# Patient Record
Sex: Female | Born: 1980 | Race: White | Hispanic: No | Smoking: Former smoker
Health system: Southern US, Community
[De-identification: ages and names within clinical notes are randomized; demographics above are authoritative.]

## PROBLEM LIST (undated history)

## (undated) DIAGNOSIS — E282 Polycystic ovarian syndrome: Secondary | ICD-10-CM

## (undated) DIAGNOSIS — M1711 Unilateral primary osteoarthritis, right knee: Secondary | ICD-10-CM

## (undated) DIAGNOSIS — M4802 Spinal stenosis, cervical region: Secondary | ICD-10-CM

## (undated) DIAGNOSIS — M5412 Radiculopathy, cervical region: Secondary | ICD-10-CM

## (undated) HISTORY — PX: COLONOSCOPY: SHX174

## (undated) HISTORY — DX: Polycystic ovarian syndrome: E28.2

## (undated) HISTORY — DX: Unilateral primary osteoarthritis, right knee: M17.11

## (undated) HISTORY — PX: AUGMENTATION MAMMAPLASTY: SUR837

## (undated) HISTORY — DX: Radiculopathy, cervical region: M54.12

## (undated) HISTORY — PX: OTHER SURGICAL HISTORY: SHX169

## (undated) HISTORY — DX: Spinal stenosis, cervical region: M48.02

---

## 2015-11-28 ENCOUNTER — Other Ambulatory Visit: Payer: Self-pay | Admitting: Physician Assistant

## 2015-11-28 DIAGNOSIS — M2392 Unspecified internal derangement of left knee: Secondary | ICD-10-CM

## 2015-12-16 ENCOUNTER — Ambulatory Visit: Payer: Self-pay

## 2015-12-19 ENCOUNTER — Ambulatory Visit: Payer: Self-pay

## 2015-12-23 ENCOUNTER — Ambulatory Visit: Payer: Self-pay

## 2017-08-05 ENCOUNTER — Ambulatory Visit (INDEPENDENT_AMBULATORY_CARE_PROVIDER_SITE_OTHER): Payer: Medicaid Other | Admitting: Obstetrics and Gynecology

## 2017-08-05 ENCOUNTER — Encounter: Payer: Self-pay | Admitting: Obstetrics and Gynecology

## 2017-08-05 VITALS — BP 148/91 | HR 110 | Ht 65.0 in | Wt 194.1 lb

## 2017-08-05 DIAGNOSIS — Z8742 Personal history of other diseases of the female genital tract: Secondary | ICD-10-CM | POA: Diagnosis not present

## 2017-08-05 DIAGNOSIS — E282 Polycystic ovarian syndrome: Secondary | ICD-10-CM | POA: Diagnosis not present

## 2017-08-05 DIAGNOSIS — N898 Other specified noninflammatory disorders of vagina: Secondary | ICD-10-CM

## 2017-08-05 DIAGNOSIS — R102 Pelvic and perineal pain: Secondary | ICD-10-CM | POA: Diagnosis not present

## 2017-08-05 NOTE — Progress Notes (Signed)
GYNECOLOGY PROGRESS NOTE  Subjective:    Patient ID: Rachel Rojas, female    DOB: 09/02/1981, 36 y.o.   MRN: [id number]  HPI  Patient is a 36 y.o. female who presents for complaints of abdominal/pelvic pain for the past several weeks.  Of note, patient has a h/o PCOS.  She is currently taking bio-identical hormones (thyroid,  Progesterone, and testosterone).  Seeing a Theatre stage manager.  Last year she had a similar abdominal pain, with associated bloating, and was diagnosed with ovarian and uterine (?) cysts. Did not require treatment.   States that her boyfriend works in Radiology and patient had an unofficial CT scan, which noted possible cysts again. Has images on disc.  Patient currently denies nausea/vomiting, dysuria, hematuria, constipation or diarrhea.   In addition, patient notes that last week had period, after not having period x 2 years. Bleeding stopped yesterday.    Past Medical History:  Diagnosis Date  . PCOS (polycystic ovarian syndrome)     Family History  Problem Relation Age of Onset  . Lung cancer Maternal Uncle   . Diabetes Maternal Grandmother   . Bladder Cancer Maternal Grandmother     Family History  Problem Relation Age of Onset  . Lung cancer Maternal Uncle   . Diabetes Maternal Grandmother   . Bladder Cancer Maternal Grandmother     History reviewed. No pertinent surgical history.   Social History   Social History  . Marital status: Divorced    Spouse name: N/A  . Number of children: N/A  . Years of education: N/A   Occupational History  . Not on file.   Social History Main Topics  . Smoking status: Light Tobacco Smoker  . Smokeless tobacco: Never Used  . Alcohol use No     Comment: 3x a week  . Drug use: No  . Sexual activity: Yes   Other Topics Concern  . Not on file   Social History Narrative  . No narrative on file    Outpatient Encounter Prescriptions as of 08/05/2017  Medication Sig  . metFORMIN (GLUCOPHAGE) 500 MG tablet  Take by mouth.  . Misc. Devices (TOPI-CLICK PERL DOSE LOAD 24MP) MISC USE AS DIRECTED  . NATURE-THROID 32.5 MG tablet (TOTAL DOSE = 130 MG) PLEASE TAKE 1 TABLET (WITH 97.5 MG TABLET) BY MOUTH EVERY MORNING AND AT 1PM 30 MINUTES BEFORE OR AFTER YOU EAT. KEEP  . NATURE-THROID 97.5 MG TABS (TOTAL DOSE = 130 MG) PLEASE TAKE 1 TABLET (WITH 32.5 MG TABLET) BY MOUTH EVERY MORNING AND AT 1PM 30 MINUTES BEFORE OR AFTER YOU EAT. KEEP  . PARoxetine (PAXIL) 20 MG tablet Take by mouth.  . Progesterone Micronized (PROGESTERONE PO) (TOTAL DOSE = 775 MG) TAKE 2 CAPSULES (WITH 175 MG CAPSULE) BY MOUTH EVERY NIGHT AT BEDTIME. MAY CAUSE DROWSINESS. KEEP AT ROOM TEMP & AWAY  . spironolactone (ALDACTONE) 50 MG tablet Take 50 mg by mouth 2 (two) times daily.  . Testosterone 20 % CREA APPLY 1 CLICK (=5.36 MILLILITER) VAGINALLY AT BEDTIME THREE TIMES A WEEK. KEEP AT [place].   No facility-administered encounter medications on file as of 08/05/2017.     No Known Allergies   Review of Systems Pertinent items noted in HPI and remainder of comprehensive ROS otherwise negative.     Objective:   Blood pressure (!) 148/91, pulse (!) 110, height 5\' 5"  (1.651 m), weight 194 lb 1.6 oz (88 kg), last menstrual period 12/17/2014. General  appearance: alert, cooperative and no distress Abdomen: soft, non-tender; bowel sounds normal; no masses,  no organomegaly Pelvic: external genitalia normal, rectovaginal septum normal.  Vagina with small amount of white thin discharge.  Cervix normal appearing, no lesions and no motion tenderness.  Uterus mobile, nontender, normal shape and size.  Adnexae non-palpable, nontender bilaterally.  Extremities: extremities normal, atraumatic, no cyanosis or edema Neurologic: Grossly normal   Assessment:   H/o PCOS Pelvic pain H/o ovarian cyst  Vaginal discharge  Plan:   - Patient possible h/o ovarian cysts, now with abdominal pain again.  Currently on  natural remedies for PCOS.  Will order pelvic ultrasound to assess for cause of pelvic pain. Discussed possible etiologies.  - Nuswab performed to r/o pelvic infection - Will notify patient of results by phone.    Rubie Maid, MD [facility]

## 2017-08-15 ENCOUNTER — Ambulatory Visit (INDEPENDENT_AMBULATORY_CARE_PROVIDER_SITE_OTHER): Payer: Medicaid Other

## 2017-08-15 DIAGNOSIS — R102 Pelvic and perineal pain: Secondary | ICD-10-CM

## 2017-08-15 DIAGNOSIS — Z8742 Personal history of other diseases of the female genital tract: Secondary | ICD-10-CM

## 2017-09-04 ENCOUNTER — Telehealth: Payer: Self-pay

## 2017-09-04 NOTE — Telephone Encounter (Signed)
-----   Message from Rubie Maid, MD sent at 09/04/2017  8:59 AM EDT ----- Please contact patient and inform her of her normal ultrasound.

## 2017-09-04 NOTE — Telephone Encounter (Signed)
Called pt, line dropped call. Will call back.

## 2017-09-05 NOTE — Telephone Encounter (Signed)
Called pt, no answer. LM for pt to call back.  

## 2017-09-20 NOTE — Telephone Encounter (Signed)
Called pt, no answer. LM for pt informing her of normal results.

## 2018-08-12 ENCOUNTER — Ambulatory Visit: Payer: Self-pay | Admitting: Physician Assistant

## 2019-02-17 ENCOUNTER — Telehealth: Payer: Self-pay | Admitting: Obstetrics and Gynecology

## 2019-02-17 NOTE — Telephone Encounter (Signed)
The patient called and stated and that she has been bleeding for 2 months. The patient wants to see Dr. Marcelline Mates asap, I scheduled the patient for next week, but the patient does not want to wait that long. Please advise.

## 2019-02-24 ENCOUNTER — Ambulatory Visit: Payer: Self-pay | Admitting: Obstetrics and Gynecology

## 2019-02-24 ENCOUNTER — Encounter: Payer: Self-pay | Admitting: Obstetrics and Gynecology

## 2019-02-24 ENCOUNTER — Other Ambulatory Visit (HOSPITAL_COMMUNITY)
Admission: RE | Admit: 2019-02-24 | Discharge: 2019-02-24 | Disposition: A | Discharge status: Home / Self Care | Payer: Medicaid Other | Source: Ambulatory Visit | Attending: Obstetrics and Gynecology | Admitting: Obstetrics and Gynecology

## 2019-02-24 VITALS — BP 136/93 | HR 98 | Ht 65.0 in | Wt 192.7 lb

## 2019-02-24 DIAGNOSIS — R5383 Other fatigue: Secondary | ICD-10-CM

## 2019-02-24 DIAGNOSIS — E669 Obesity, unspecified: Secondary | ICD-10-CM

## 2019-02-24 DIAGNOSIS — Z124 Encounter for screening for malignant neoplasm of cervix: Secondary | ICD-10-CM

## 2019-02-24 DIAGNOSIS — E282 Polycystic ovarian syndrome: Secondary | ICD-10-CM

## 2019-02-24 DIAGNOSIS — N938 Other specified abnormal uterine and vaginal bleeding: Secondary | ICD-10-CM

## 2019-02-24 MED ORDER — MEDROXYPROGESTERONE ACETATE 10 MG PO TABS: 10.0000 mg | ORAL_TABLET | Freq: Every day | ORAL | 4 refills | Status: DC

## 2019-02-24 NOTE — Patient Instructions (Signed)
Polycystic Ovarian Syndrome  Polycystic ovarian syndrome (PCOS) is a common hormonal disorder among women of reproductive age. In most women with PCOS, many small fluid-filled sacs (cysts) grow on the ovaries, and the cysts are not part of a normal menstrual cycle. PCOS can cause problems with your menstrual periods and make it difficult to get pregnant. It can also cause an increased risk of miscarriage with pregnancy. If it is not treated, PCOS can lead to serious health problems, such as diabetes and heart disease. What are the causes? The cause of PCOS is not known, but it may be the result of a combination of certain factors, such as:  Irregular menstrual cycle.  High levels of certain hormones (androgens).  Problems with the hormone that helps to control blood sugar (insulin resistance).  Certain genes. What increases the risk? This condition is more likely to develop in women who have a family history of PCOS. What are the signs or symptoms? Symptoms of PCOS may include:  Multiple ovarian cysts.  Infrequent periods or no periods.  Periods that are too frequent or too heavy.  Unpredictable periods.  Inability to get pregnant (infertility) because of not ovulating.  Increased growth of hair on the face, chest, stomach, back, thumbs, thighs, or toes.  Acne or oily skin. Acne may develop during adulthood, and it may not respond to treatment.  Pelvic pain.  Weight gain or obesity.  Patches of thickened and dark brown or black skin on the neck, arms, breasts, or thighs (acanthosis nigricans).  Excess hair growth on the face, chest, abdomen, or upper thighs (hirsutism). How is this diagnosed? This condition is diagnosed based on:  Your medical history.  A physical exam, including a pelvic exam. Your health care provider may look for areas of increased hair growth on your skin.  Tests, such as: ? Ultrasound. This may be used to examine the ovaries and the lining of the  uterus (endometrium) for cysts. ? Blood tests. These may be used to check levels of sugar (glucose), female hormone (testosterone), and female hormones (estrogen and progesterone) in your blood. How is this treated? There is no cure for PCOS, but treatment can help to manage symptoms and prevent more health problems from developing. Treatment varies depending on:  Your symptoms.  Whether you want to have a baby or whether you need birth control (contraception). Treatment may include nutrition and lifestyle changes along with:  Progesterone hormone to start a menstrual period.  Birth control pills to help you have regular menstrual periods.  Medicines to make you ovulate, if you want to get pregnant.  Medicine to reduce excessive hair growth.  Surgery, in severe cases. This may involve making small holes in one or both of your ovaries. This decreases the amount of testosterone that your body produces. Follow these instructions at home:  Take over-the-counter and prescription medicines only as told by your health care provider.  Follow a healthy meal plan. This can help you reduce the effects of PCOS. ? Eat a healthy diet that includes lean proteins, complex carbohydrates, fresh fruits and vegetables, low-fat dairy products, and healthy fats. Make sure to eat enough fiber.  If you are overweight, lose weight as told by your health care provider. ? Losing 10% of your body weight may improve symptoms. ? Your health care provider can determine how much weight loss is best for you and can help you lose weight safely.  Keep all follow-up visits as told by your health care provider.  provider.  ? Losing 10% of your body weight may improve symptoms.  ? Your health care provider can determine how much weight loss is best for you and can help you lose weight safely.  · Keep all follow-up visits as told by your health care provider. This is important.  Contact a health care provider if:  · Your symptoms do not get better with medicine.  · You develop new symptoms.  This information is not intended to replace advice given to you by your health care provider. Make sure you discuss any questions you have with your health care provider.  Document  Released: 03/29/2005 Document Revised: 07/31/2016 Document Reviewed: 05/20/2016  Elsevier Interactive Patient Education © 2019 [employer]

## 2019-02-24 NOTE — Progress Notes (Signed)
Pt is present today due to having her cycle for 2 months straight. Pt do not remember the first day it started. Pt stated that she is having heavy bleeding, cramping and clots.

## 2019-02-24 NOTE — Progress Notes (Signed)
Entered in Error

## 2019-02-24 NOTE — Progress Notes (Signed)
GYNECOLOGY CLINIC PROGRESS NOTE Subjective:   HPI: Rachel Rojas is a 38 y.o. female here for bleeding x 2 months.   Patient with a history of PCOS who was previously seeing a Theatre stage manager and taking bio-identical hormones (thyroid, progesterone, and testosterone) but stopped taking them along with all of her other medications 8 months ago because she could not afford to pay for them anymore. After ~4 months of being off medication, began having bleeding. For the last 2 months patient has been having constant heavy bleeding with clots and mild cramping in the morning that resolves quickly. Patient states that she has not gone no longer than 12 hours without bleeding over the last 2 months. She has also been feeling fatigued, weak, and dizzy at times. She reports that she is taking an iron supplement. She states that she does not need contraception because her boyfriend has had a vasectomy. She desires medication but does not want IUD or any medications with estrogen. Denies n/v, constipation,  diarrhea, dysuria, hematuria, or vaginal discharge.    Gynecologic History No LMP recorded. (Menstrual status: Irregular Periods). Contraception: vasectomy Last Pap: Patient reports that pap over 5 years ago. Results were: normal  Menstrual History: OB History    Gravida  2   Para  1   Term  1   Preterm      AB  1   Living  1     SAB      TAB      Ectopic      Multiple      Live Births  1             The following portions of the patient's history were reviewed and updated as appropriate:  She  has a past medical history of PCOS (polycystic ovarian syndrome).   She  has a past surgical history that includes no surgery history.   Her family history includes Bladder Cancer in her maternal grandmother; Diabetes in her maternal grandmother; Lung cancer in her maternal uncle.   She  reports that she has quit smoking. She has never used smokeless tobacco. She reports current  alcohol use. She reports that she does not use drugs.   Current Outpatient Medications on File Prior to Visit  Medication Sig Dispense Refill  . ferrous sulfate 325 (65 FE) MG tablet Take 325 mg by mouth daily with breakfast.     No current facility-administered medications on file prior to visit.    She has No Known Allergies..  Review of Systems Pertinent items noted in HPI and remainder of comprehensive ROS otherwise negative.    Objective:    BP (!) 136/93   Pulse 98   Ht 5\' 5"  (1.651 m)   Wt 192 lb 11.2 oz (87.4 kg)   BMI 32.07 kg/m   General:   alert, no distress and mildly obese  Abdomen:  soft, non-tender; bowel sounds normal; no masses,  no organomegaly  Pelvic:   external genitalia normal, rectovaginal septum normal.  Vagina without discharge, scant dark red blood in vaginal vault.  Cervix normal appearing, no lesions and no motion tenderness.  Uterus mobile, nontender, normal shape and size.  Adnexae non-palpable, nontender bilaterally.   Extremities:  extremities normal, atraumatic, no cyanosis or edema  Neurologic:  grossly normal     Assessment:    Polycystic ovarian disease   Dysfunctional uterine bleeding  Mild obesity  Fatigue Cervical cancer screening  Plan:    Blood tests: CBC with  diff, Estradiol, FSH, LH, Progesterone level, TSH and Iron studies, Testosterone and DHEA levels. Diagnosis explained in detail, including differential. Pelvic ultrasound.   Discussed management options, including non-estrogen containing contraceptives (declines estrogen therapy if possible) such as IUD, oral progesterone (Provera with q 3 month cycles vs progesterone-only OCPs), Nexplanon, Depo Provera.  Not interested in surgical management at this time. After discussion, patient notes she would like to do q 3 month Provera challenges. Will order.  Pap smear performed today in light of abnormal bleeding and overdue for cervical cancer screen.   Patient to f/u in 3 months for  annual exama.  She is follow up sooner if symptoms persist or worsen.    Rubie Maid, MD [facility]

## 2019-02-25 ENCOUNTER — Encounter: Payer: Self-pay | Admitting: Obstetrics and Gynecology

## 2019-02-25 LAB — CBC
HEMOGLOBIN: 14.7 g/dL (ref 11.1–15.9)
Hematocrit: 43.1 % (ref 34.0–46.6)
MCH: 30.8 pg (ref 26.6–33.0)
MCHC: 34.1 g/dL (ref 31.5–35.7)
MCV: 90 fL (ref 79–97)
Platelets: 291 10*3/uL (ref 150–450)
RBC: 4.78 x10E6/uL (ref 3.77–5.28)
RDW: 12.7 % (ref 11.7–15.4)
WBC: 7.7 10*3/uL (ref 3.4–10.8)

## 2019-02-25 LAB — IRON,TIBC AND FERRITIN PANEL
Ferritin: 38 ng/mL (ref 15–150)
Iron Saturation: 18 % (ref 15–55)
Iron: 56 ug/dL (ref 27–159)
Total Iron Binding Capacity: 307 ug/dL (ref 250–450)
UIBC: 251 ug/dL (ref 131–425)

## 2019-02-25 LAB — FSH/LH
FSH: 3.3 m[IU]/mL
LH: 7.1 m[IU]/mL

## 2019-02-25 LAB — TESTOSTERONE, FREE, TOTAL, SHBG
Sex Hormone Binding: 27.6 nmol/L (ref 24.6–122.0)
Testosterone, Free: 4.8 pg/mL — ABNORMAL HIGH (ref 0.0–4.2)
Testosterone: 33 ng/dL (ref 8–48)

## 2019-02-25 LAB — ESTRADIOL: Estradiol: 155.1 pg/mL

## 2019-02-25 LAB — TSH: TSH: 4.7 u[IU]/mL — AB (ref 0.450–4.500)

## 2019-02-25 LAB — PROGESTERONE: Progesterone: 0.2 ng/mL

## 2019-02-25 LAB — DHEA-SULFATE: DHEA-SO4: 350.2 ug/dL — ABNORMAL HIGH (ref 57.3–279.2)

## 2019-02-27 LAB — CYTOLOGY - PAP
Diagnosis: UNDETERMINED — AB
HPV: NOT DETECTED

## 2019-03-05 ENCOUNTER — Other Ambulatory Visit: Payer: Self-pay

## 2019-03-05 ENCOUNTER — Ambulatory Visit
Admission: RE | Admit: 2019-03-05 | Discharge: 2019-03-05 | Disposition: A | Discharge status: Home / Self Care | Payer: Medicaid Other | Source: Ambulatory Visit | Attending: Obstetrics and Gynecology | Admitting: Obstetrics and Gynecology

## 2019-03-05 DIAGNOSIS — E282 Polycystic ovarian syndrome: Secondary | ICD-10-CM

## 2019-03-07 MED ORDER — MEDROXYPROGESTERONE ACETATE 10 MG PO TABS: 10.0000 mg | ORAL_TABLET | Freq: Every day | ORAL | 4 refills | Status: DC

## 2019-03-07 NOTE — Telephone Encounter (Signed)
Yes. I can see what's going on. It's because we initially planned for you to only have to to take it every 3 months.  I will change the prescription so you can have access to it again.

## 2019-03-12 ENCOUNTER — Telehealth: Payer: Self-pay | Admitting: Obstetrics and Gynecology

## 2019-03-12 NOTE — Telephone Encounter (Signed)
The patient called and stated that she has an episode last night. The patient stated she is severally bleeding and needs to speak with her nurse as soon as possible. Please advise.

## 2019-03-12 NOTE — Telephone Encounter (Signed)
Pt stated that she had sexually intercourse last night and forgot to take her provera pill. Pt stated that she got up and took if as soon as she remembered. Pt stated that she started bleeding really heavy with clots and a lot of cramping. Pt stated that she was taking ibuprofen for the pain. Pt stated that she had to use 2 tampons to get from her bathroom to the kitchen. Pt made an appointment for a televisit on Monday, March 16, 2019 at 1:30pm. Pt asked when should she seek medical care. Pt was advised that if she felt lightheaded from all the of bleeding to go to an urgent care to seek medical care and have them check her hemoglobin.

## 2019-03-13 NOTE — Telephone Encounter (Signed)
What you informed her is correct.  She really needs to be on dual hormone therapy but has been declining so far. I will discuss this with her again on Monday when I speak to her.

## 2019-03-16 ENCOUNTER — Encounter: Payer: Self-pay | Admitting: Obstetrics and Gynecology

## 2019-03-16 ENCOUNTER — Ambulatory Visit (INDEPENDENT_AMBULATORY_CARE_PROVIDER_SITE_OTHER): Payer: Medicaid Other | Admitting: Obstetrics and Gynecology

## 2019-03-16 ENCOUNTER — Other Ambulatory Visit: Payer: Self-pay

## 2019-03-16 ENCOUNTER — Other Ambulatory Visit: Payer: Medicaid Other

## 2019-03-16 VITALS — Ht 65.0 in | Wt 192.0 lb

## 2019-03-16 DIAGNOSIS — N938 Other specified abnormal uterine and vaginal bleeding: Secondary | ICD-10-CM

## 2019-03-16 DIAGNOSIS — N939 Abnormal uterine and vaginal bleeding, unspecified: Secondary | ICD-10-CM

## 2019-03-16 DIAGNOSIS — Z130 Encounter for screening for diseases of the blood and blood-forming organs and certain disorders involving the immune mechanism: Secondary | ICD-10-CM

## 2019-03-16 MED ORDER — NORETHINDRONE ACET-ETHINYL EST 1.5-30 MG-MCG PO TABS: ORAL_TABLET | ORAL | 3 refills | Status: DC

## 2019-03-16 NOTE — Progress Notes (Signed)
Virtual Visit via Telephone Note  I connected with Rachel Rojas on 03/16/19 at  1:50 PM EDT by telephone and verified that I am speaking with the correct person using two identifiers.   I discussed the limitations, risks, security and privacy concerns of performing an evaluation and management service by telephone and the availability of in person appointments. I also discussed with the patient that there may be a patient responsible charge related to this service. The patient expressed understanding and agreed to proceed.  The patient was at home, and I placed the call from the office.    History of Present Illness: Patient is a 38 y.o. [id number] female who complains of worsening of her abnormal uterine bleeding.  She initially noted the abnormal bleeding ~ 3 months ago, was seen last month for the bleeding and was prescribed Provera 10 mg tablets for 10 days.  She notes that this did stop her bleeding for a short while, however 2-3 days later after stopping the course, the bleeding returned, however was much lighter.  She states that she was prepared to take a second round of the Provera but had difficulty getting her refill from the pharmacy for ~ 2 days.  She started the second course, but had intercourse on Day 1 of taking the next round of medication, and has been bleeding much heavier since that time. She now complains that she is feeling more tired and fatigued, and is currently still bleeding fairly heavily. She is also noting that her blood pressure is running lower than what it usually does. She denies SOB or chest pain.    Observations/Objective: Wt Readings from Last 3 Encounters:  03/16/19 192 lb (87.1 kg)  02/24/19 192 lb 11.2 oz (87.4 kg)  08/05/17 194 lb 1.6 oz (88 kg)   Temp Readings from Last 3 Encounters:  No data found for Temp   BP Readings from Last 3 Encounters:  02/24/19 (!) 136/93  08/05/17 (!) 148/91   Pulse Readings from Last 3 Encounters:  02/24/19 98  08/05/17 (!)  110    Last Imaging:   US PELVIS (TRANSABDOMINAL ONLY) CLINICAL DATA:  Dysfunctional uterine bleeding, irregular menses in vaginal bleeding for 2.5 months, history of polycystic ovarian syndrome, former smoker  EXAM: TRANSABDOMINAL AND TRANSVAGINAL ULTRASOUND OF PELVIS  TECHNIQUE: Both transabdominal and transvaginal ultrasound examinations of the pelvis were performed. Transabdominal technique was performed for global imaging of the pelvis including uterus, ovaries, adnexal regions, and pelvic cul-de-sac. It was necessary to proceed with endovaginal exam following the transabdominal exam to visualize the uterus, endometrium, and ovaries.  COMPARISON:  None  FINDINGS: Uterus  Measurements: 10.0 x 6.0 x 6.9 cm = volume: 218 mL. Normal morphology without mass  Endometrium  Thickness: 15 mm.  No endometrial fluid or focal abnormality  Right ovary  Measurements: 3.0 x 1.8 x 1.7 cm = volume: 4.9 mL. Dominant follicle without mass  Left ovary  Measurements: 3.3 x 2.1 x 2.2 cm = volume: 8.0 mL. Dominant follicle without mass  Other findings  Small amount of free pelvic fluid.  No adnexal masses.  IMPRESSION: Upper normal thickness of endometrial complex without focal abnormality.  Otherwise negative exam.  Electronically Signed   By: Lavonia Dana M.D.   On: 03/05/2019 15:31 US PELVIS TRANSVANGINAL NON-OB (TV ONLY) CLINICAL DATA:  Dysfunctional uterine bleeding, irregular menses in vaginal bleeding for 2.5 months, history of polycystic ovarian syndrome, former smoker  EXAM: TRANSABDOMINAL AND TRANSVAGINAL ULTRASOUND OF PELVIS  TECHNIQUE: Both transabdominal  and transvaginal ultrasound examinations of the pelvis were performed. Transabdominal technique was performed for global imaging of the pelvis including uterus, ovaries, adnexal regions, and pelvic cul-de-sac. It was necessary to proceed with endovaginal exam following the transabdominal exam to  visualize the uterus, endometrium, and ovaries.  COMPARISON:  None  FINDINGS: Uterus  Measurements: 10.0 x 6.0 x 6.9 cm = volume: 218 mL. Normal morphology without mass  Endometrium  Thickness: 15 mm.  No endometrial fluid or focal abnormality  Right ovary  Measurements: 3.0 x 1.8 x 1.7 cm = volume: 4.9 mL. Dominant follicle without mass  Left ovary  Measurements: 3.3 x 2.1 x 2.2 cm = volume: 8.0 mL. Dominant follicle without mass  Other findings  Small amount of free pelvic fluid.  No adnexal masses.  IMPRESSION: Upper normal thickness of endometrial complex without focal abnormality.  Otherwise negative exam.  Electronically Signed   By: Lavonia Dana M.D.   On: 03/05/2019 15:31    Labs:  Lab Results  Component Value Date   WBC 7.7 02/24/2019   HGB 14.7 02/24/2019   HCT 43.1 02/24/2019   MCV 90 02/24/2019   PLT 291 02/24/2019   Lab Results  Component Value Date   TSH 4.700 (H) 02/24/2019    Assessment and Plan:  1. Abnormal uterine bleeding - patient has been trying to treat with progesterone-only products thus far, however recommended that she likely needs both estrogen and progesterone in the short term to help manage her bleeding. Will prescribe OCP taper to temporize bleeding, and then can discuss further longer-term management with progesterone-only products if desired.  2. H/o hypothyroidism - TSH mildly elevated at last visit.  Patient aware that she needs to f/u with PCP and likely resume her thyroid medications (as she has been off for the past year or so). Could also be contributing to her symptoms.  3.  Fatigue - concern for anemia due to patient's new complaints of fatigue. Patient advised to f/u in clinic for lab draw today to evaluate for possible anemia, and if levels are severely low, may need to f/u in the Emergency Room for blood transfusion due to symptoms.  4. HTN - patient with h/o HTN, has been controlled, however noting symptoms of  lower than normal BPs lately.  Usually would recommend progesterone only options due to age over 37 and HTN, however patient should be ok to take OCP taper in emergency case (for short term), and then can decide on more long-term management once symptoms improve.   Follow Up Instructions:  Follow up today for stat lab draw.  Will notify patient of labs by phone, may need to f/u in the Emergency Room if levels are very low.  Will also need to start OCP taper. Prescription called to pharmacy.   I discussed the assessment and treatment plan with the patient. The patient was provided an opportunity to ask questions and all were answered. The patient agreed with the plan and demonstrated an understanding of the instructions.   The patient was advised to call back or seek an in-person evaluation if the symptoms worsen or if the condition fails to improve as anticipated.  I provided 10 minutes of non-face-to-face time during this encounter.   Rubie Maid, MD  [facility]

## 2019-03-16 NOTE — Progress Notes (Signed)
Pt televisit transferred from front desk. Medication, medical history, and allegies reviewed and updated. Pt stated that she has had a cycle and  bleeding for 3 months. Pt stated that she is having large clots and extreme cramps. Pt stated that she has been weak and tired.

## 2019-03-16 NOTE — Addendum Note (Signed)
Addended by: Lorinda Creed on: 03/16/2019 03:00 PM   Modules accepted: Orders

## 2019-03-17 ENCOUNTER — Telehealth: Payer: Self-pay | Admitting: Obstetrics and Gynecology

## 2019-03-17 LAB — IRON,TIBC AND FERRITIN PANEL
Ferritin: 36 ng/mL (ref 15–150)
Iron Saturation: 17 % (ref 15–55)
Iron: 50 ug/dL (ref 27–159)
Total Iron Binding Capacity: 302 ug/dL (ref 250–450)
UIBC: 252 ug/dL (ref 131–425)

## 2019-03-17 LAB — CBC
Hematocrit: 40 % (ref 34.0–46.6)
Hemoglobin: 13.9 g/dL (ref 11.1–15.9)
MCH: 31.1 pg (ref 26.6–33.0)
MCHC: 34.8 g/dL (ref 31.5–35.7)
MCV: 90 fL (ref 79–97)
Platelets: 322 10*3/uL (ref 150–450)
RBC: 4.47 x10E6/uL (ref 3.77–5.28)
RDW: 12.6 % (ref 11.7–15.4)
WBC: 9.2 10*3/uL (ref 3.4–10.8)

## 2019-03-17 NOTE — Telephone Encounter (Signed)
The patient called concerned about her not hearing anything back in regards to her bloodwork. Please advise.

## 2019-03-18 NOTE — Telephone Encounter (Signed)
Pt called no answer LM via voicemail to call the office to go over her test results.

## 2019-03-20 NOTE — Telephone Encounter (Signed)
Please see my chart messages

## 2019-05-26 ENCOUNTER — Telehealth: Payer: Self-pay

## 2019-05-26 NOTE — Progress Notes (Signed)
Pt is present today for a 3 month follow up after starting Junel for abnormal bleeding. Pt stated the Junel is working and her bleeding has slowed down but now she is having other issues like HRT issues.

## 2019-05-26 NOTE — Telephone Encounter (Signed)
Pt prescreened no symptoms has face mask.   Coronavirus (COVID-19) Are you at risk?  Are you at risk for the Coronavirus (COVID-19)?  To be considered HIGH RISK for Coronavirus (COVID-19), you have to meet the following criteria:  . Traveled to [place], [place], [place], [place] or [place]; or in the [place] to [place], [place], [place], or [place]; and have fever, cough, and shortness of breath within the last 2 weeks of travel OR . Been in close contact with a person diagnosed with COVID-19 within the last 2 weeks and have fever, cough, and shortness of breath . IF YOU DO NOT MEET THESE CRITERIA, YOU ARE CONSIDERED LOW RISK FOR COVID-19.  What to do if you are HIGH RISK for COVID-19?  . If you are having a medical emergency, call 911. . Seek medical care right away. Before you go to a doctor's office, urgent care or emergency department, call ahead and tell them about your recent travel, contact with someone diagnosed with COVID-19, and your symptoms. You should receive instructions from your physician's office regarding next steps of care.  . When you arrive at healthcare provider, tell the healthcare staff immediately you have returned from visiting [place], [place], [place], [place] or [place]; or traveled in the [place] to [place], [place], [place], or [place]; in the last two weeks or you have been in close contact with a person diagnosed with COVID-19 in the last 2 weeks.   . Tell the health care staff about your symptoms: fever, cough and shortness of breath. . After you have been seen by a medical provider, you will be either: o Tested for (COVID-19) and discharged home on quarantine except to seek medical care if symptoms worsen, and asked to  - Stay home and avoid contact with others until you get your results (4-5 days)  - Avoid travel on public transportation if possible (such as bus, train, or airplane) or o Sent to the Emergency Department by EMS for  evaluation, COVID-19 testing, and possible admission depending on your condition and test results.  What to do if you are LOW RISK for COVID-19?  Reduce your risk of any infection by using the same precautions used for avoiding the common cold or flu:  . Wash your hands often with soap and warm water for at least 20 seconds.  If soap and water are not readily available, use an alcohol-based hand sanitizer with at least 60% alcohol.  . If coughing or sneezing, cover your mouth and nose by coughing or sneezing into the elbow areas of your shirt or coat, into a tissue or into your sleeve (not your hands). . Avoid shaking hands with others and consider head nods or verbal greetings only. . Avoid touching your eyes, nose, or mouth with unwashed hands.  . Avoid close contact with people who are sick. . Avoid places or events with large numbers of people in one location, like concerts or sporting events. . Carefully consider travel plans you have or are making. . If you are planning any travel outside or inside the [place], visit the CDC's Travelers' Health webpage for the latest health notices. . If you have some symptoms but not all symptoms, continue to monitor at home and seek medical attention if your symptoms worsen. . If you are having a medical emergency, call 911.   ADDITIONAL HEALTHCARE OPTIONS FOR PATIENTS  [facility] / e-Visit: [email or web address]           [facility]: [phone]  [facility]: [phone]                   [facility]: [phone]  

## 2019-05-27 ENCOUNTER — Ambulatory Visit (INDEPENDENT_AMBULATORY_CARE_PROVIDER_SITE_OTHER): Payer: Medicaid Other | Admitting: Obstetrics and Gynecology

## 2019-05-27 ENCOUNTER — Encounter: Payer: Self-pay | Admitting: Obstetrics and Gynecology

## 2019-05-27 ENCOUNTER — Other Ambulatory Visit: Payer: Self-pay

## 2019-05-27 VITALS — BP 134/82 | HR 84 | Ht 65.0 in | Wt 187.9 lb

## 2019-05-27 DIAGNOSIS — E282 Polycystic ovarian syndrome: Secondary | ICD-10-CM

## 2019-05-27 DIAGNOSIS — R7989 Other specified abnormal findings of blood chemistry: Secondary | ICD-10-CM

## 2019-05-27 DIAGNOSIS — N938 Other specified abnormal uterine and vaginal bleeding: Secondary | ICD-10-CM

## 2019-05-27 MED ORDER — METFORMIN HCL 500 MG PO TABS: ORAL_TABLET | ORAL | 5 refills | Status: DC

## 2019-05-27 NOTE — Progress Notes (Signed)
    GYNECOLOGY PROGRESS NOTE  Subjective:    Patient ID: Rachel Rojas, female    DOB: 09/18/1981, 38 y.o.   MRN: [id number]  HPI  Patient is a 38 y.o. G9P1011 female who presents for 3 month f/u of dysfunctional uterine bleeding and h/o PCOS.  She was started on Junel OCP, after failed therapy with Provera to help with manage her almost daily bleeding for several months.  Patient notes that the medication has worked well to stop her bleeding, as she has not had a menstrual cycle in 3 months, however she is noting side effects/symptoms, including decreased sex drive, and sometimes just "not feeling herself".   Of note, patient wonders if it would be beneficial to see an Endocrinologist for her PCOS.  She also reports that she had a thyroid test several months ago that was borderline elevated, but no further action was taken. Patient wonders if this could e affecting her as well. Also desires to resume use of her Metformin. States she was on this medication several years ago but self discontinued (at that time was trying to find more natural remedies to manage her PCOS).  The following portions of the patient's history were reviewed and updated as appropriate: allergies, current medications, past family history, past medical history, past social history, past surgical history and problem list.  Review of Systems Pertinent items noted in HPI and remainder of comprehensive ROS otherwise negative.   Objective:   Blood pressure 134/82, pulse 84, height 5\' 5"  (1.651 m), weight 187 lb 14.4 oz (85.2 kg), last menstrual period 03/06/2019.  General appearance: alert and no distress Remainder of exam deferred    Assessment:   Dysfunctional uterine bleeding PCOS Abnormal TSH  Plan:   1. Patient desire to try something different to continue management of her cycles, but does not like the affects of estrogen.  Since her bleeding has improved, can attempt to switch to progesterone-only OCP. Given a 1  month sample of Slynd. Patient can call back if desires a prescription 2. PCOS - patient notes that she would like a referral to an Endocrinologist to help with further management. Will place referral.  Also desires to resume her Metformin.  Order placed  3. Abnormal TSH - will recheck TSH levels and order full panel. Can f/u with Endocrinologist.   Return to clinic for any scheduled appointments or for any gynecologic concerns as needed.    Rubie Maid, MD [facility]

## 2019-05-28 DIAGNOSIS — R7989 Other specified abnormal findings of blood chemistry: Secondary | ICD-10-CM | POA: Insufficient documentation

## 2019-05-28 DIAGNOSIS — R638 Other symptoms and signs concerning food and fluid intake: Secondary | ICD-10-CM | POA: Insufficient documentation

## 2019-05-28 DIAGNOSIS — E282 Polycystic ovarian syndrome: Secondary | ICD-10-CM | POA: Insufficient documentation

## 2019-05-28 DIAGNOSIS — N938 Other specified abnormal uterine and vaginal bleeding: Secondary | ICD-10-CM | POA: Insufficient documentation

## 2019-05-28 LAB — THYROID PANEL WITH TSH
Free Thyroxine Index: 2.4 (ref 1.2–4.9)
T3 Uptake Ratio: 24 % (ref 24–39)
T4, Total: 10.2 ug/dL (ref 4.5–12.0)
TSH: 4.47 u[IU]/mL (ref 0.450–4.500)

## 2019-06-25 ENCOUNTER — Other Ambulatory Visit: Payer: Self-pay

## 2019-06-25 MED ORDER — SLYND 4 MG PO TABS: 4.0000 mg | ORAL_TABLET | Freq: Every day | ORAL | 11 refills | Status: DC

## 2019-07-28 ENCOUNTER — Encounter: Payer: Self-pay | Admitting: Internal Medicine

## 2020-02-21 IMAGING — US US PELVIS COMPLETE
1 series · 14 of 25 positions shown · non-contrast
Comparison: None

CLINICAL DATA: Dysfunctional uterine bleeding, irregular menses in
vaginal bleeding for 2.5 months, history of polycystic ovarian
syndrome, former smoker



[Series 1: us pelvis complete · 0.26mm/px · 14 of 103 slices shown]
[im 1/103]
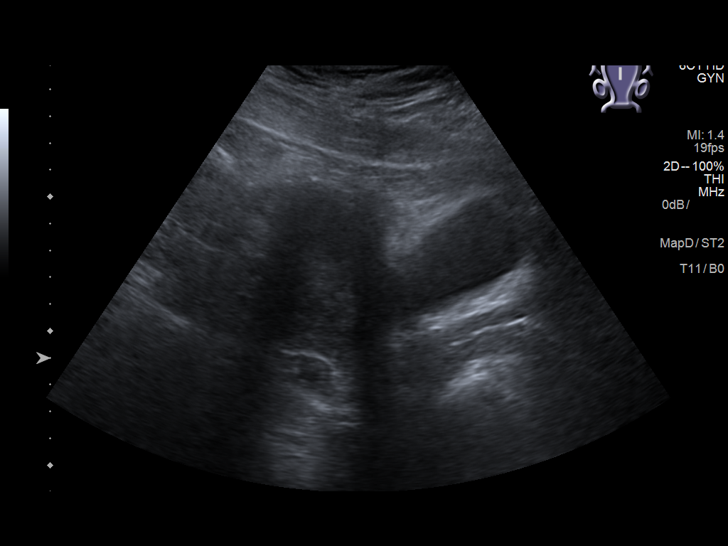
[im 9/103]
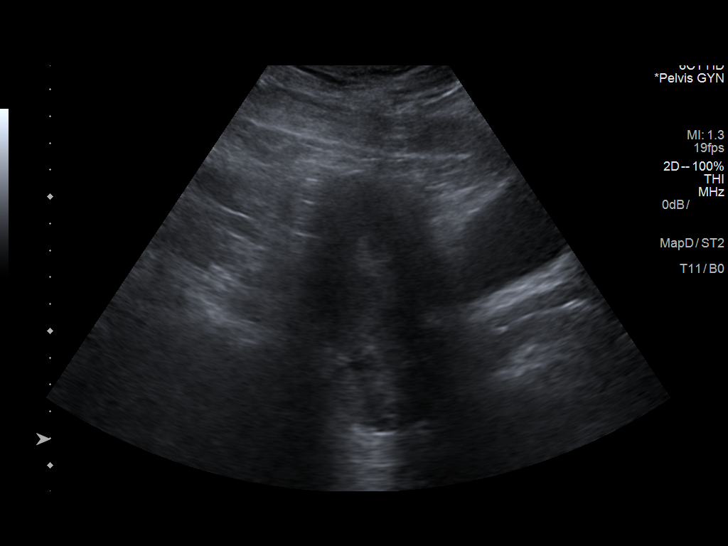
[im 18/103]
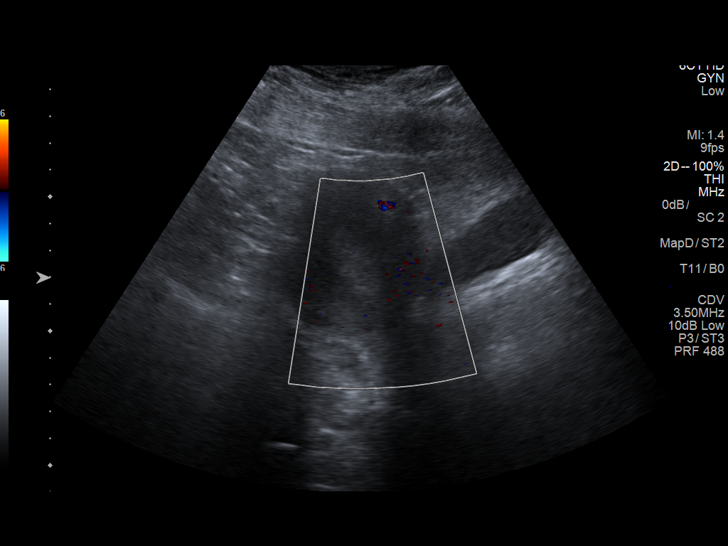
[im 26/103]
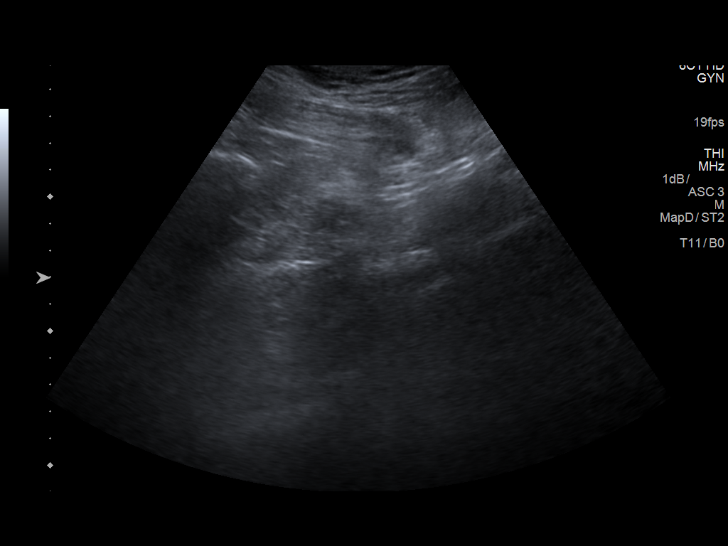
[im 35/103]
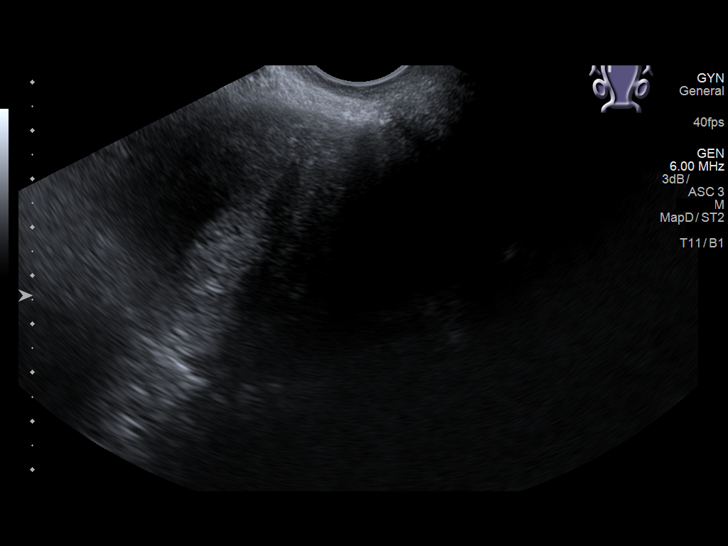
[im 39/103]
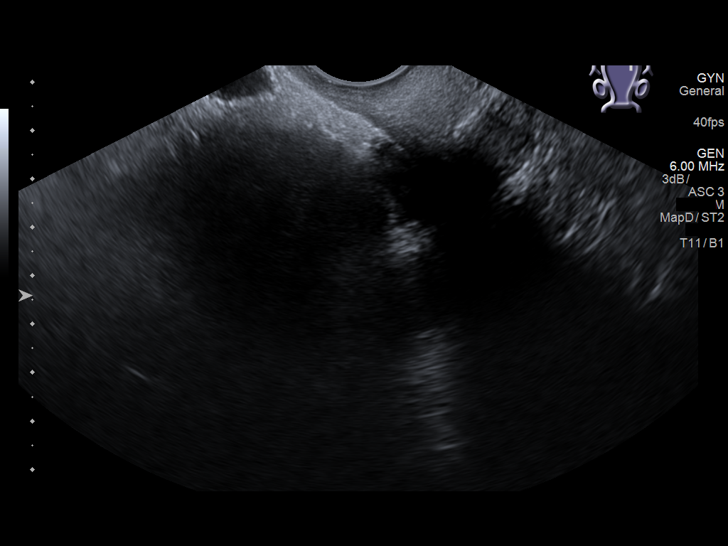
[im 47/103]
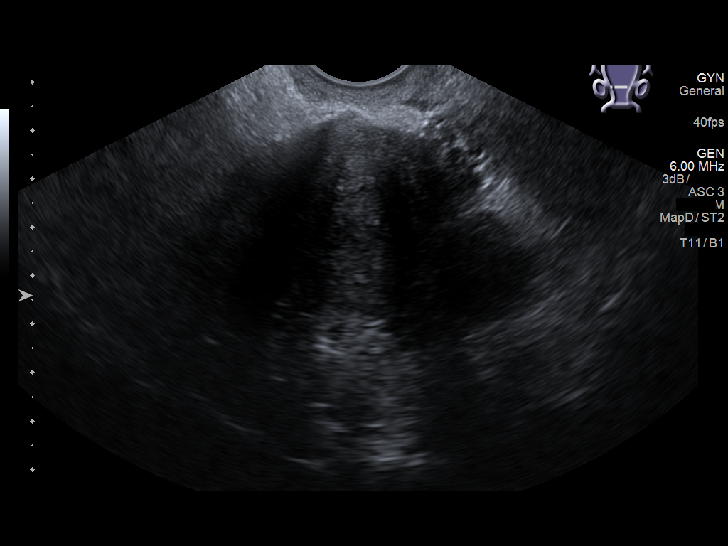
[im 56/103]
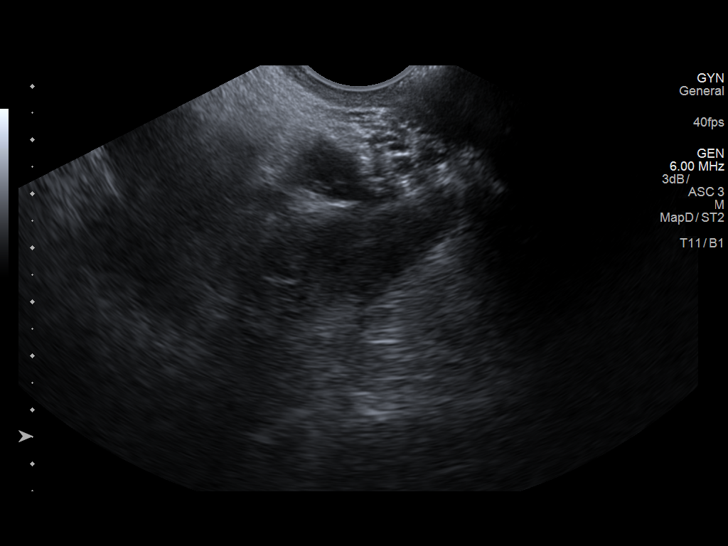
[im 64/103]
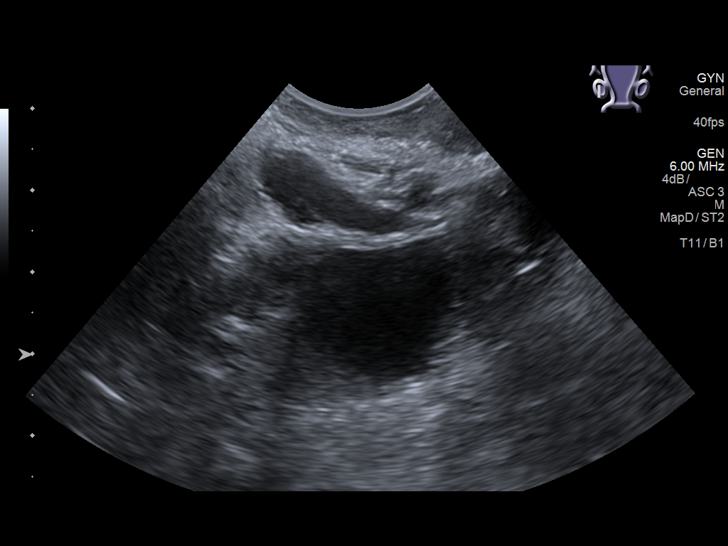
[im 69/103]
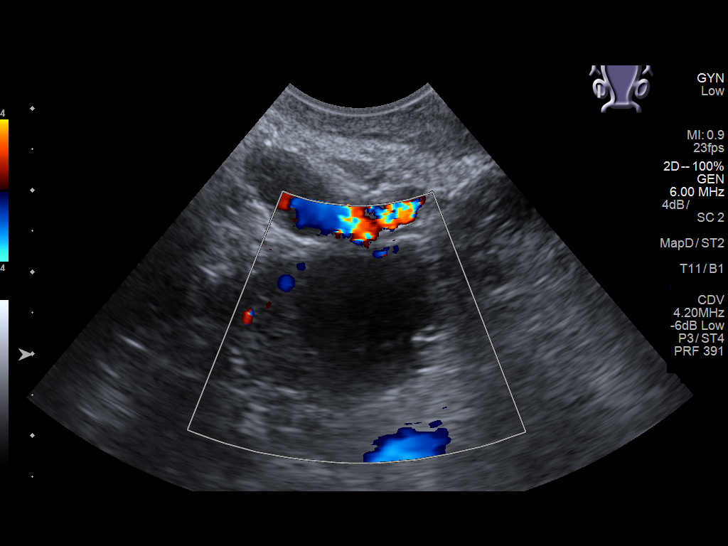
[im 77/103]
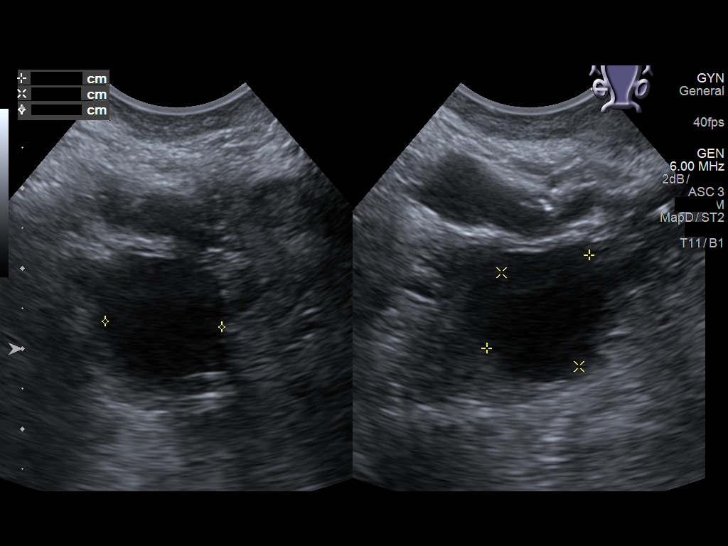
[im 86/103]
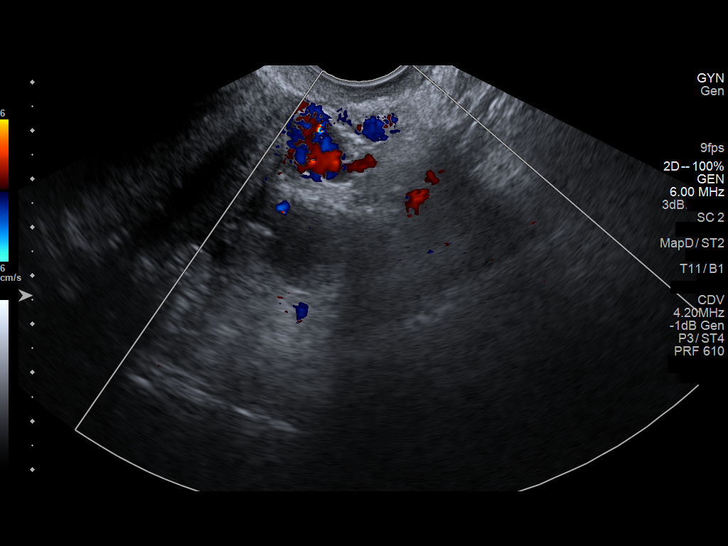
[im 94/103]
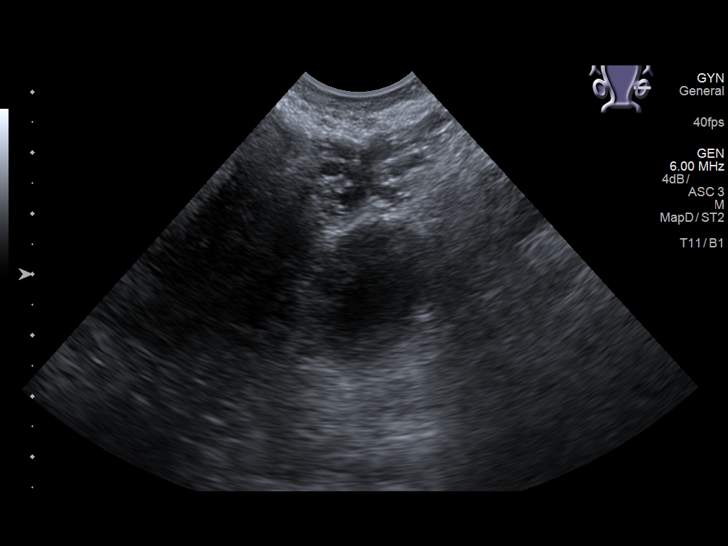
[im 103/103]
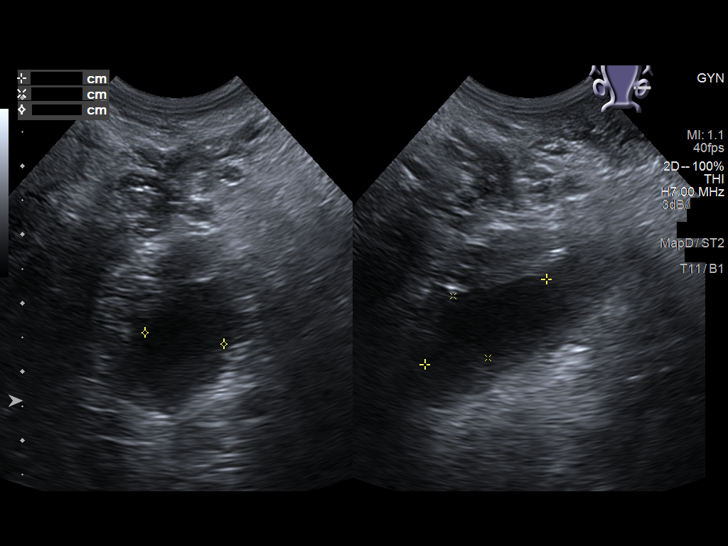

[14 of 25 positions shown; findings below may reference images not displayed]

FINDINGS: Uterus

Measurements: 10.0 x 6.0 x 6.9 cm = volume: 218 mL. Normal
morphology without mass

Endometrium

Thickness: 15 mm.  No endometrial fluid or focal abnormality

Right ovary

Measurements: 3.0 x 1.8 x 1.7 cm = volume: 4.9 mL. Dominant follicle
without mass

Left ovary

Measurements: 3.3 x 2.1 x 2.2 cm = volume: 8.0 mL. Dominant follicle
without mass

Other findings

Small amount of free pelvic fluid.  No adnexal masses.
IMPRESSION: Upper normal thickness of endometrial complex without focal
abnormality.

Otherwise negative exam.

## 2020-06-27 ENCOUNTER — Other Ambulatory Visit: Payer: Self-pay | Admitting: Obstetrics and Gynecology

## 2020-06-27 NOTE — Telephone Encounter (Signed)
Patient needs an annual exam scheduled prior to next refill.

## 2020-06-28 NOTE — Telephone Encounter (Signed)
Sent pt a mychart message 06/28/2020.

## 2020-09-16 NOTE — Telephone Encounter (Signed)
Hello Ladies, Will you please contact this pt and schedule her for an annual exam and birth control refill. Thanks [employer]

## 2020-11-15 ENCOUNTER — Encounter (INDEPENDENT_AMBULATORY_CARE_PROVIDER_SITE_OTHER): Payer: Self-pay | Admitting: Nurse Practitioner

## 2020-11-15 ENCOUNTER — Ambulatory Visit (INDEPENDENT_AMBULATORY_CARE_PROVIDER_SITE_OTHER): Payer: 59 | Admitting: Nurse Practitioner

## 2020-11-15 ENCOUNTER — Other Ambulatory Visit: Payer: Self-pay

## 2020-11-15 VITALS — BP 142/84 | HR 110 | Temp 98.2°F | Ht 64.0 in | Wt 225.0 lb

## 2020-11-15 DIAGNOSIS — E669 Obesity, unspecified: Secondary | ICD-10-CM | POA: Diagnosis not present

## 2020-11-15 DIAGNOSIS — R7303 Prediabetes: Secondary | ICD-10-CM | POA: Diagnosis not present

## 2020-11-15 DIAGNOSIS — R5383 Other fatigue: Secondary | ICD-10-CM

## 2020-11-15 DIAGNOSIS — Z8639 Personal history of other endocrine, nutritional and metabolic disease: Secondary | ICD-10-CM

## 2020-11-15 DIAGNOSIS — Z6838 Body mass index (BMI) 38.0-38.9, adult: Secondary | ICD-10-CM

## 2020-11-15 DIAGNOSIS — Z13220 Encounter for screening for lipoid disorders: Secondary | ICD-10-CM

## 2020-11-15 NOTE — Patient Instructions (Addendum)
To schedule an appointment with [facility] for your lab draw visit [email or web address] or Call: [phone]. Or you may go to [facility] as a walk-in. Their [place] location address is [address], [place], [place]. Their hours are Monday-Friday from 7:00AM-12:00PM and 1:00PM-5:00PM.   [facility] Phone/Fax Numbers Phone: [phone]  Fax: [phone]  [place] Dietary Recommendations for Weight Loss What to Avoid . Avoid added sugars o Often added sugar can be found in processed foods such as many condiments, dry cereals, cakes, cookies, chips, crisps, crackers, candies, sweetened drinks, etc.  o Read labels and AVOID/DECREASE use of foods with the following in their ingredient list: Sugar, fructose, high fructose corn syrup, sucrose, glucose, maltose, dextrose, molasses, cane sugar, brown sugar, any type of syrup, agave nectar, etc.   . Avoid snacking in between meals . Avoid foods made with flour o If you are going to eat food made with flour, choose those made with whole-grains; and, minimize your consumption as much as is tolerable . Avoid processed foods o These foods are generally stocked in the middle of the grocery store. Focus on shopping on the perimeter of the grocery.  . Avoid Meat  o We recommend following a plant-based diet at [facility]. Thus, we recommend avoiding meat as a general rule. Consider eating beans, legumes, eggs, and/or dairy products for regular protein sources o If you plan on eating meat limit to 4 ounces of meat at a time and choose lean options such as Fish, chicken, [place]. Avoid red meat intake such as pork and/or steak What to Include . Vegetables o GREEN LEAFY VEGETABLES: Kale, spinach, mustard greens, collard greens, cabbage, broccoli, etc. o OTHER: Asparagus, cauliflower, eggplant, carrots, peas, Brussel sprouts, tomatoes, bell peppers, zucchini, beets, cucumbers, etc. . Grains, seeds, and  legumes o Beans: kidney beans, black eyed peas, garbanzo beans, black beans, pinto beans, etc. o Whole, unrefined grains: brown rice, barley, bulgur, oatmeal, etc. . Healthy fats  o Avoid highly processed fats such as vegetable oil o Examples of healthy fats: avocado, olives, virgin olive oil, dark chocolate (?72% Cocoa), nuts (peanuts, almonds, walnuts, cashews, pecans, etc.) . None to Low Intake of Animal Sources of Protein o Meat sources: chicken, [place], salmon, tuna. Limit to 4 ounces of meat at one time. o Consider limiting dairy sources, but when choosing dairy focus on: PLAIN [place] yogurt, cottage cheese, high-protein milk . Fruit o Choose berries  When to Eat . Intermittent Fasting: o Choosing not to eat for a specific time period, but DO FOCUS ON HYDRATION when fasting o Multiple Techniques: - Time Restricted Eating: eat 3 meals in a day, each meal lasting no more than 60 minutes, no snacks between meals - 16-18 hour fast: fast for 16 to 18 hours up to 7 days a week. Often suggested to start with 2-3 nonconsecutive days per week.  . Remember the time you sleep is counted as fasting.  . Examples of eating schedule: Fast from 7:00pm-11:00am. Eat between 11:00am-7:00pm.  - 24-hour fast: fast for 24 hours up to every other day. Often suggested to start with 1 day per week . Remember the time you sleep is counted as fasting . Examples of eating schedule:  o Eating day: eat 2-3 meals on your eating day. If doing 2 meals, each meal should last no more than 90 minutes. If doing 3 meals, each meal should last no more than 60 minutes. Finish last meal by 7:00pm. o Fasting day: Fast  until 7:00pm.  o IF YOU FEEL UNWELL FOR ANY REASON/IN ANY WAY WHEN FASTING, STOP FASTING BY EATING A NUTRITIOUS SNACK OR LIGHT MEAL o ALWAYS FOCUS ON HYDRATION DURING FASTS - Acceptable Hydration sources: water, broths, tea/coffee (black tea/coffee is best but using a small amount of whole-fat dairy products in  coffee/tea is acceptable).  - Poor Hydration Sources: anything with sugar or artificial sweeteners added to it  These recommendations have been developed for patients that are actively receiving medical care from either Dr. Anastasio Champion or Jeralyn Ruths, DNP, NP-C at [facility]. These recommendations are developed for patients with specific medical conditions and are not meant to be distributed or used by others that are not actively receiving care from either provider listed above at [facility]. It is not appropriate to participate in the above eating plans without proper medical supervision.   Reference: Rexanne Mano. The obesity code. [place]; 2016.

## 2020-11-15 NOTE — Progress Notes (Signed)
Subjective:  Patient ID: Rachel Rojas, female    DOB: June 22, 1981  Age: 39 y.o. MRN: [id number]  CC:  Chief Complaint  Patient presents with  . Establish Care    patient needs back surgery and needs her glucose/A1c checked before surgery      HPI  This patient arrives today for the above.  She is here to establish care at this office.  She did not have a primary care provider that she is seeing regularly.  She tells me that she is planning on getting neck surgery, but was told she needs to be evaluated by a primary care physician for her elevated glucose and elevated A1c before she can undergo surgery.  She does have blood work that she had done at [facility] about 2 weeks ago.  This shows an A1c of 5.9.  She also shows me that she had a CBC and basic metabolic panel completed at that time as well CBC was within normal limits and BMP was normal except for a sodium level of 134.  She is also concerned regarding her weight.  She tells me she has gained a significant amount of weight over the last few months.  She was on bioidentical hormone replacement therapy in the past and would like to discuss this at some point in the future.  She is perimenopausal and tells me over the last year or so she has had 1 or 2 menstrual cycles.  She does admit to feeling fatigued most every day.  Past Medical History:  Diagnosis Date  . PCOS (polycystic ovarian syndrome)       Family History  Problem Relation Age of Onset  . Hypertension Mother   . Lung cancer Maternal Uncle   . Diabetes Maternal Grandmother   . Bladder Cancer Maternal Grandmother     Social History   Social History Narrative  . Not on file   Social History   Tobacco Use  . Smoking status: Former Research scientist (life sciences)  . Smokeless tobacco: Never Used  Substance Use Topics  . Alcohol use: Yes    Alcohol/week: 2.0 standard drinks    Types: 2 Glasses of wine per week    Comment: occassionally     Current Meds  Medication Sig  .  Ascorbic Acid (VITAMIN C) 500 MG CAPS Take by mouth as needed.   Jolyne Loa Grape-Goldenseal (BERBERINE COMPLEX PO)   . Cholecalciferol (CVS VIT D 5000 HIGH-POTENCY PO) Take by mouth.  . Cinnamon 500 MG capsule Take 750 mg by mouth daily.   Marland Kitchen gabapentin (NEURONTIN) 600 MG tablet Take 600 mg by mouth 3 (three) times daily.  . Magnesium Citrate 125 MG CAPS 150 mg  . Multiple Vitamins-Minerals (MULTI + OMEGA-3 ADULT GUMMIES) CHEW   . oxyCODONE-acetaminophen (PERCOCET) 7.5-325 MG tablet Take 1 tablet by mouth 2 (two) times daily as needed.  Marland Kitchen SLYND 4 MG TABS TAKE 1 TABLET BY MOUTH EVERY DAY    ROS:  See HPI  Objective:   Today's Vitals: BP (!) 142/84   Pulse (!) 110   Temp 98.2 F (36.8 C) (Temporal)   Ht 5' 4" (1.626 m)   Wt 225 lb (102.1 kg)   SpO2 97%   BMI 38.62 kg/m  Vitals with BMI 11/15/2020 05/27/2019 03/16/2019  Height 5' 4" 5' 5" 5' 5"  Weight 225 lbs 187 lbs 14 oz 192 lbs  BMI 38.6 09.98 33.82  Systolic 505 397 -  Diastolic 84 82 -  Pulse 673 84 -  Physical Exam Vitals reviewed.  Constitutional:      General: She is not in acute distress.    Appearance: Normal appearance.  HENT:     Head: Normocephalic and atraumatic.  Neck:     Vascular: No carotid bruit.  Cardiovascular:     Rate and Rhythm: Normal rate and regular rhythm.     Pulses: Normal pulses.     Heart sounds: Normal heart sounds.  Pulmonary:     Effort: Pulmonary effort is normal.     Breath sounds: Normal breath sounds.  Skin:    General: Skin is warm and dry.  Neurological:     General: No focal deficit present.     Mental Status: She is alert and oriented to person, place, and time.  Psychiatric:        Mood and Affect: Mood normal.        Behavior: Behavior normal.        Judgment: Judgment normal.          Assessment and Plan   1. Fatigue, unspecified type   2. Class 2 obesity with body mass index (BMI) of 38.0 to 38.9 in adult, unspecified obesity type, unspecified  whether serious comorbidity present   3. History of vitamin D deficiency   4. Prediabetes   5. Screening for lipid disorders      Plan: 1.-5.  We will start by collecting blood work today for further evaluation.  I encouraged her to come to her blood work appointment in a fasting state so we can check her metabolic panel (specifically fasting glucose), lipid panel, thyroid panel, will check hormone levels as she is interested in considering bioidentical hormone replacement therapy and she is perimenopausal, and we will check vitamin D level as she does endorse having been told she has had low vitamin D in the past.  She is on vitamin D supplement currently.  Based on her A1c that was collected on 11/02/2020 I do not see any reason to delay surgery as she is not in the diabetic range but is prediabetic.  We will work with her regarding lifestyle and dietary changes to address her prediabetes and hopefully reverse her prediabetes as well as delay onset of type 2 diabetes.   Tests ordered Orders Placed This Encounter  Procedures  . TSH  . T3, Free  . T4, Free  . Estradiol  . Progesterone  . Testosterone, Free, Total, SHBG  . Vitamin D, 25-hydroxy  . Lipid Panel  . CMP with eGFR([facility])      No orders of the defined types were placed in this encounter.   Patient to follow-up in 1 month or sooner as needed.  Ailene Ards, NP

## 2020-12-02 HISTORY — PX: CERVICAL SPINE SURGERY: SHX589

## 2020-12-21 LAB — COMPLETE METABOLIC PANEL WITH GFR
AG Ratio: 1.6 (calc) (ref 1.0–2.5)
ALT: 37 U/L — ABNORMAL HIGH (ref 6–29)
AST: 38 U/L — ABNORMAL HIGH (ref 10–30)
Albumin: 4.5 g/dL (ref 3.6–5.1)
Alkaline phosphatase (APISO): 78 U/L (ref 31–125)
BUN: 11 mg/dL (ref 7–25)
CO2: 26 mmol/L (ref 20–32)
Calcium: 10 mg/dL (ref 8.6–10.2)
Chloride: 100 mmol/L (ref 98–110)
Creat: 0.78 mg/dL (ref 0.50–1.10)
GFR, Est African American: 109 mL/min/{1.73_m2} (ref >=60)
GFR, Est Non African American: 94 mL/min/{1.73_m2} (ref >=60)
Globulin: 2.8 g/dL (calc) (ref 1.9–3.7)
Glucose, Bld: 95 mg/dL (ref 65–99)
Potassium: 4 mmol/L (ref 3.5–5.3)
Sodium: 137 mmol/L (ref 135–146)
Total Bilirubin: 0.5 mg/dL (ref 0.2–1.2)
Total Protein: 7.3 g/dL (ref 6.1–8.1)

## 2020-12-21 LAB — LIPID PANEL
Cholesterol: 229 mg/dL — ABNORMAL HIGH (ref <200)
HDL: 47 mg/dL — ABNORMAL LOW (ref >=50)
LDL Cholesterol (Calc): 141 mg/dL (calc) — ABNORMAL HIGH
Non-HDL Cholesterol (Calc): 182 mg/dL (calc) — ABNORMAL HIGH (ref <130)
Total CHOL/HDL Ratio: 4.9 (calc) (ref <5.0)
Triglycerides: 266 mg/dL — ABNORMAL HIGH (ref <150)

## 2020-12-21 LAB — ESTRADIOL: Estradiol: 389 pg/mL — ABNORMAL HIGH

## 2020-12-21 LAB — VITAMIN D 25 HYDROXY (VIT D DEFICIENCY, FRACTURES): Vit D, 25-Hydroxy: 66 ng/mL (ref 30–100)

## 2020-12-21 LAB — PROGESTERONE: Progesterone: 0.5 ng/mL

## 2020-12-21 LAB — TSH: TSH: 2.59 mIU/L

## 2020-12-21 LAB — T4, FREE: Free T4: 1.1 ng/dL (ref 0.8–1.8)

## 2020-12-21 LAB — T3, FREE: T3, Free: 3.9 pg/mL (ref 2.3–4.2)

## 2020-12-22 ENCOUNTER — Ambulatory Visit (INDEPENDENT_AMBULATORY_CARE_PROVIDER_SITE_OTHER): Payer: 59 | Admitting: Internal Medicine

## 2020-12-22 ENCOUNTER — Encounter (INDEPENDENT_AMBULATORY_CARE_PROVIDER_SITE_OTHER): Payer: Self-pay | Admitting: Internal Medicine

## 2020-12-22 ENCOUNTER — Other Ambulatory Visit: Payer: Self-pay

## 2020-12-22 ENCOUNTER — Encounter: Payer: Medicaid Other | Admitting: Obstetrics and Gynecology

## 2020-12-22 VITALS — BP 130/80 | HR 78 | Temp 97.9°F | Resp 18 | Ht 64.0 in | Wt 214.0 lb

## 2020-12-22 DIAGNOSIS — E282 Polycystic ovarian syndrome: Secondary | ICD-10-CM | POA: Diagnosis not present

## 2020-12-22 DIAGNOSIS — E785 Hyperlipidemia, unspecified: Secondary | ICD-10-CM | POA: Diagnosis not present

## 2020-12-22 DIAGNOSIS — E669 Obesity, unspecified: Secondary | ICD-10-CM | POA: Diagnosis not present

## 2020-12-22 MED ORDER — PROGESTERONE 200 MG PO CAPS: 200.0000 mg | ORAL_CAPSULE | Freq: Every day | ORAL | 0 refills | Status: DC

## 2020-12-22 MED ORDER — NP THYROID 60 MG PO TABS: 60.0000 mg | ORAL_TABLET | Freq: Every day | ORAL | 3 refills | Status: DC

## 2020-12-22 NOTE — Progress Notes (Signed)
Metrics: Intervention Frequency ACO  Documented Smoking Status Yearly  Screened one or more times in 24 months  Cessation Counseling or  Active cessation medication Past 24 months  Past 24 months   Guideline developer: UpToDate (See UpToDate for funding source) Date Released: 2014       Wellness Office Visit  Subjective:  Patient ID: Shakeia Krus, female    DOB: 1981/09/07  Age: 40 y.o. MRN: [id number]  CC: This lady comes in for follow-up after having been seen by Maralyn Sago as a initial visit.  She has previously been diagnosed with PCOS. HPI  She was seeing a physician in [place] who specialized in biological hormone therapy and was being treated with high-dose progesterone, Nature-Throid and testosterone. Previous lab work that I see in the system does confirm a diagnosis of PCOS with a FSH/LH ratio consistent with this diagnosis.  Her free testosterone levels were also elevated. She has had 1 previous miscarriage. She has dyslipidemia, consistent with PCOS.  Previous TSH levels were elevated indicative of thyroid hypofunction. On closer questioning she does have symptoms of thyroid hypofunction. Past Medical History:  Diagnosis Date  . PCOS (polycystic ovarian syndrome)    Past Surgical History:  Procedure Laterality Date  . CERVICAL SPINE SURGERY  12/02/2020   C5-[address] [place]ers  . no surgery history       Family History  Problem Relation Age of Onset  . Hypertension Mother   . Lung cancer Maternal Uncle   . Diabetes Maternal Grandmother   . 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   Social History   Tobacco Use  . Smoking status: Former Games developer  . Smokeless tobacco: Never Used  Substance Use Topics  . Alcohol use: Yes    Alcohol/week: 2.0 standard drinks    Types: 2 Glasses of wine per week    Comment:  occassionally    Current Meds  Medication Sig  . Ascorbic Acid (VITAMIN C) 500 MG CAPS Take by mouth as needed.   Claris Gower Grape-Goldenseal (BERBERINE COMPLEX PO)   . Cholecalciferol (CVS VIT D 5000 HIGH-POTENCY PO) Take by mouth.  . Cinnamon 500 MG capsule Take 750 mg by mouth daily.   Marland Kitchen gabapentin (NEURONTIN) 600 MG tablet Take 600 mg by mouth 3 (three) times daily.  . Magnesium Citrate 125 MG CAPS 150 mg  . Multiple Vitamins-Minerals (MULTI + OMEGA-3 ADULT GUMMIES) CHEW   . NP THYROID 60 MG tablet Take 1 tablet (60 mg total) by mouth daily before breakfast.  . progesterone (PROMETRIUM) 200 MG capsule Take 1 capsule (200 mg total) by mouth daily.  Marland Kitchen tiZANidine (ZANAFLEX) 2 MG tablet Take by mouth.      No flowsheet data found.   Objective:   Today's Vitals: BP 130/80 (BP Location: Left Arm, Patient Position: Sitting, Cuff Size: Large)   Pulse 78   Temp 97.9 F (36.6 C) (Temporal)   Resp 18   Ht 5\' 4"  (1.626 m)   Wt 214 lb (97.1 kg)   SpO2 97%   BMI 36.73 kg/m  Vitals with BMI 12/22/2020 11/15/2020 05/27/2019  Height 5\' 4"  5\' 4"  5\' 5"   Weight 214 lbs 225 lbs 187 lbs 14 oz  BMI 36.72 38.6 31.27  Systolic 130 142 07/27/2019  Diastolic 80 84 82  Pulse 78 110 84     Physical Exam  She is morbidly obese.  Blood pressure acceptable.     Assessment   1. PCOS (polycystic ovarian syndrome)   2. Dyslipidemia   3. Obesity (BMI 30-39.9)       Tests ordered No orders of the defined types were placed in this encounter.    Plan: 1. I spent some time today discussing the pathophysiology of PCOS based on insulin resistance and increased visceral fat.  It is important that we reduce the visceral fat and reduce insulin resistance.  I explained the complications of PCOS such as increased risk of breast and uterine cancer as well as coronary artery disease, hypertension, diabetes and dyslipidemia. 2. I will treat her with progesterone at a reasonable dose begin with as this  is one of the defects in PCOS.  She says she has not had a menstrual cycle for a very long time.  She does not use birth control pills.  Her fianc has had a vasectomy. 3. I will start her, off label, on desiccated NP thyroid for improvement of insulin resistance and also to treat symptoms of thyroid hypofunction. 4. We discussed nutrition in detail with the concept of intermittent fasting combined with a plant-based diet.  I gave her a diet sheet. 5. I will see her in about a couple of months to see her progress and we will probably do some blood work then.  I spent 45 minutes with this patient at least discussing all her previous results, PCOS and therapy for this.   Meds ordered this encounter  Medications  . NP THYROID 60 MG tablet    Sig: Take 1 tablet (60 mg total) by mouth daily before breakfast.    Dispense:  30 tablet    Refill:  3  . progesterone (PROMETRIUM) 200 MG capsule    Sig: Take 1 capsule (200 mg total) by mouth daily.    Dispense:  90 capsule    Refill:  0    Gerilynn Mccullars Normajean Glasgow, MD

## 2020-12-22 NOTE — Patient Instructions (Signed)
[facility] Dietary Recommendations for Weight Loss What to Avoid . Avoid added sugars o Often added sugar can be found in processed foods such as many condiments, dry cereals, cakes, cookies, chips, crisps, crackers, candies, sweetened drinks, etc.  o Read labels and AVOID/DECREASE use of foods with the following in their ingredient list: Sugar, fructose, high fructose corn syrup, sucrose, glucose, maltose, dextrose, molasses, cane sugar, brown sugar, any type of syrup, agave nectar, etc.   . Avoid snacking in between meals . Avoid foods made with flour o If you are going to eat food made with flour, choose those made with whole-grains; and, minimize your consumption as much as is tolerable . Avoid processed foods o These foods are generally stocked in the middle of the grocery store. Focus on shopping on the perimeter of the grocery.  . Avoid Meat  o We recommend following a plant-based diet at [facility]. Thus, we recommend avoiding meat as a general rule. Consider eating beans, legumes, eggs, and/or dairy products for regular protein sources o If you plan on eating meat limit to 4 ounces of meat at a time and choose lean options such as Fish, chicken, turkey. Avoid red meat intake such as pork and/or steak What to Include . Vegetables o GREEN LEAFY VEGETABLES: Kale, spinach, mustard greens, collard greens, cabbage, broccoli, etc. o OTHER: Asparagus, cauliflower, eggplant, carrots, peas, Brussel sprouts, tomatoes, bell peppers, zucchini, beets, cucumbers, etc. . Grains, seeds, and legumes o Beans: kidney beans, black eyed peas, garbanzo beans, black beans, pinto beans, etc. o Whole, unrefined grains: brown rice, barley, bulgur, oatmeal, etc. . Healthy fats  o Avoid highly processed fats such as vegetable oil o Examples of healthy fats: avocado, olives, virgin olive oil, dark chocolate (?72% Cocoa), nuts (peanuts, almonds, walnuts, cashews, pecans, etc.) . None to Low  Intake of Animal Sources of Protein o Meat sources: chicken, turkey, salmon, tuna. Limit to 4 ounces of meat at one time. o Consider limiting dairy sources, but when choosing dairy focus on: PLAIN Greek yogurt, cottage cheese, high-protein milk . Fruit o Choose berries  When to Eat . Intermittent Fasting: o Choosing not to eat for a specific time period, but DO FOCUS ON HYDRATION when fasting o Multiple Techniques: - Time Restricted Eating: eat 3 meals in a day, each meal lasting no more than 60 minutes, no snacks between meals - 16-18 hour fast: fast for 16 to 18 hours up to 7 days a week. Often suggested to start with 2-3 nonconsecutive days per week.  . Remember the time you sleep is counted as fasting.  . Examples of eating schedule: Fast from 7:00pm-11:00am. Eat between 11:00am-7:00pm.  - 24-hour fast: fast for 24 hours up to every other day. Often suggested to start with 1 day per week . Remember the time you sleep is counted as fasting . Examples of eating schedule:  o Eating day: eat 2-3 meals on your eating day. If doing 2 meals, each meal should last no more than 90 minutes. If doing 3 meals, each meal should last no more than 60 minutes. Finish last meal by 7:00pm. o Fasting day: Fast until 7:00pm.  o IF YOU FEEL UNWELL FOR ANY REASON/IN ANY WAY WHEN FASTING, STOP FASTING BY EATING A NUTRITIOUS SNACK OR LIGHT MEAL o ALWAYS FOCUS ON HYDRATION DURING FASTS - Acceptable Hydration sources: water, broths, tea/coffee (black tea/coffee is best but using a small amount of whole-fat dairy products in coffee/tea is acceptable).  -   Poor Hydration Sources: anything with sugar or artificial sweeteners added to it  These recommendations have been developed for patients that are actively receiving medical care from either Dr. Gordon Carlson or Sarah Gray, DNP, NP-C at [facility]. These recommendations are developed for patients with specific medical conditions and are not meant to be  distributed or used by others that are not actively receiving care from either provider listed above at [facility]. It is not appropriate to participate in the above eating plans without proper medical supervision.   Reference: Fung, J. The obesity code. [place]: [place]; 2016.   

## 2021-01-05 ENCOUNTER — Encounter (INDEPENDENT_AMBULATORY_CARE_PROVIDER_SITE_OTHER): Payer: Self-pay | Admitting: Internal Medicine

## 2021-02-09 ENCOUNTER — Encounter (INDEPENDENT_AMBULATORY_CARE_PROVIDER_SITE_OTHER): Payer: Self-pay | Admitting: Internal Medicine

## 2021-02-09 ENCOUNTER — Other Ambulatory Visit: Payer: Self-pay

## 2021-02-09 ENCOUNTER — Ambulatory Visit (INDEPENDENT_AMBULATORY_CARE_PROVIDER_SITE_OTHER): Payer: 59 | Admitting: Internal Medicine

## 2021-02-09 VITALS — BP 133/70 | HR 88 | Temp 97.6°F | Resp 18 | Ht 64.0 in | Wt 208.0 lb

## 2021-02-09 DIAGNOSIS — E785 Hyperlipidemia, unspecified: Secondary | ICD-10-CM

## 2021-02-09 DIAGNOSIS — N939 Abnormal uterine and vaginal bleeding, unspecified: Secondary | ICD-10-CM

## 2021-02-09 DIAGNOSIS — E669 Obesity, unspecified: Secondary | ICD-10-CM | POA: Diagnosis not present

## 2021-02-09 DIAGNOSIS — L709 Acne, unspecified: Secondary | ICD-10-CM

## 2021-02-09 DIAGNOSIS — E282 Polycystic ovarian syndrome: Secondary | ICD-10-CM

## 2021-02-09 MED ORDER — SPIRONOLACTONE 50 MG PO TABS: 50.0000 mg | ORAL_TABLET | Freq: Every day | ORAL | 1 refills | Status: DC

## 2021-02-09 MED ORDER — NP THYROID 90 MG PO TABS: 90.0000 mg | ORAL_TABLET | Freq: Every day | ORAL | 3 refills | Status: DC

## 2021-02-09 MED ORDER — PROGESTERONE 200 MG PO CAPS: 400.0000 mg | ORAL_CAPSULE | Freq: Every day | ORAL | 0 refills | Status: DC

## 2021-02-09 NOTE — Progress Notes (Signed)
Metrics: Intervention Frequency ACO  Documented Smoking Status Yearly  Screened one or more times in 24 months  Cessation Counseling or  Active cessation medication Past 24 months  Past 24 months   Guideline developer: UpToDate (See UpToDate for funding source) Date Released: 2014       Wellness Office Visit  Subjective:  Patient ID: Rachel Rojas, female    DOB: 18-Jul-1981  Age: 40 y.o. MRN: [id number]  CC: This lady comes in for follow-up of PCOS, obesity, dyslipidemia and thyroid hypofunction. HPI  She has tolerated NP thyroid 60 mg daily without any problems.  She continues on progesterone at night and is tolerating this well.  However, she has noticed that she has had significant vaginal bleeding which started yesterday and has had clots also.  She has not had this for a long time. She continues to work with intermittent fasting and more of a plant-based diet and seems to be doing well with this. She walks on a regular basis now in terms of exercise. She is still troubled by hirsutism and acne on her face.  These are all symptoms consistent with her PCOS. Past Medical History:  Diagnosis Date  . PCOS (polycystic ovarian syndrome)    Past Surgical History:  Procedure Laterality Date  . CERVICAL SPINE SURGERY  12/02/2020   C5-7 [facility] Martyn Ehrich  . no surgery history       Family History  Problem Relation Age of Onset  . Hypertension Mother   . Lung cancer Maternal Uncle   . Diabetes Maternal Grandmother   . Bladder Cancer Maternal Grandmother     Social History   Social History Narrative   Engaged lives with boyfriend for 3 years.Previously married for 7 years.Self-employed Corporate treasurer.   Social History   Tobacco Use  . Smoking status: Former Research scientist (life sciences)  . Smokeless tobacco: Never Used  Substance Use Topics  . Alcohol use: Yes    Alcohol/week: 2.0 standard drinks    Types: 2 Glasses of wine per week    Comment: occassionally    Current Meds  Medication  Sig  . Barberry-Oreg Grape-Goldenseal (BERBERINE COMPLEX PO)   . Cholecalciferol (CVS VIT D 5000 HIGH-POTENCY PO) Take by mouth.  . Cinnamon 500 MG capsule Take 750 mg by mouth daily.   . Magnesium Citrate 125 MG CAPS 150 mg  . Multiple Vitamins-Minerals (MULTI + OMEGA-3 ADULT GUMMIES) CHEW   . NP THYROID 60 MG tablet Take 1 tablet (60 mg total) by mouth daily before breakfast.  . NP THYROID 90 MG tablet Take 1 tablet (90 mg total) by mouth daily.  Marland Kitchen spironolactone (ALDACTONE) 50 MG tablet Take 1 tablet (50 mg total) by mouth daily.  . [DISCONTINUED] gabapentin (NEURONTIN) 600 MG tablet Take 600 mg by mouth 3 times/day as needed-between meals & bedtime. Takes at bedtime  . [DISCONTINUED] progesterone (PROMETRIUM) 200 MG capsule Take 1 capsule (200 mg total) by mouth daily.       Objective:   Today's Vitals: BP 133/70 (BP Location: Right Arm, Patient Position: Sitting, Cuff Size: Normal)   Pulse 88   Temp 97.6 F (36.4 C) (Temporal)   Resp 18   Ht 5\' 4"  (1.626 m)   Wt 208 lb (94.3 kg)   SpO2 98%   BMI 35.70 kg/m  Vitals with BMI 02/09/2021 12/22/2020 11/15/2020  Height 5\' 4"  5\' 4"  5\' 4"   Weight 208 lbs 214 lbs 225 lbs  BMI 35.69 76.28 31.5  Systolic 176 160 737  Diastolic 70 80 84  Pulse 88 78 110     Physical Exam  She remains obese but has lost 6 pounds since last time I saw her.  Blood pressure acceptable.     Assessment   1. Vaginal bleeding   2. PCOS (polycystic ovarian syndrome)   3. Dyslipidemia   4. Obesity (BMI 30-39.9)   5. Acne, unspecified acne type       Tests ordered Orders Placed This Encounter  Procedures  . US Pelvic Complete With Transvaginal     Plan: 1. As far as the vagina bleeding is concerned, I think we will get her pelvic ultrasound for further evaluation.  She may need gynecological referral but in the meantime I have asked her to increase progesterone to 400 mg at night and have sent a new prescription to reflect this. 2. I think  we could further optimize thyroid to improve insulin resistance so I have told her to start taking NP thyroid 90 mg daily once she finishes with the NP thyroid 60 mg tablets. 3. The acne is from her PCOS and we will use spironolactone for this.  I have started her on this now. 4. Follow-up in about a month's time to see how she is doing.   Meds ordered this encounter  Medications  . NP THYROID 90 MG tablet    Sig: Take 1 tablet (90 mg total) by mouth daily.    Dispense:  30 tablet    Refill:  3  . progesterone (PROMETRIUM) 200 MG capsule    Sig: Take 2 capsules (400 mg total) by mouth daily.    Dispense:  180 capsule    Refill:  0  . spironolactone (ALDACTONE) 50 MG tablet    Sig: Take 1 tablet (50 mg total) by mouth daily.    Dispense:  90 tablet    Refill:  1    Angellica Maddison Luther Parody, MD

## 2021-03-20 ENCOUNTER — Telehealth (INDEPENDENT_AMBULATORY_CARE_PROVIDER_SITE_OTHER): Payer: Self-pay

## 2021-03-20 ENCOUNTER — Ambulatory Visit (HOSPITAL_COMMUNITY): Payer: 59 | Attending: Internal Medicine

## 2021-03-20 NOTE — Telephone Encounter (Signed)
Return call back to [facility]. RE: Facility Tax ID#. Was given to next phone Rep @ [place]. They are updating in the system with order and request.  TBA for Prior Auth in process.

## 2021-04-13 ENCOUNTER — Encounter (INDEPENDENT_AMBULATORY_CARE_PROVIDER_SITE_OTHER): Payer: Self-pay | Admitting: Internal Medicine

## 2021-04-13 ENCOUNTER — Telehealth (INDEPENDENT_AMBULATORY_CARE_PROVIDER_SITE_OTHER): Payer: Self-pay

## 2021-04-13 ENCOUNTER — Ambulatory Visit (INDEPENDENT_AMBULATORY_CARE_PROVIDER_SITE_OTHER): Payer: 59 | Admitting: Internal Medicine

## 2021-04-13 ENCOUNTER — Other Ambulatory Visit: Payer: Self-pay

## 2021-04-13 VITALS — BP 140/81 | HR 117 | Temp 97.5°F | Resp 18 | Ht 64.0 in | Wt 198.6 lb

## 2021-04-13 DIAGNOSIS — R7303 Prediabetes: Secondary | ICD-10-CM | POA: Diagnosis not present

## 2021-04-13 DIAGNOSIS — E282 Polycystic ovarian syndrome: Secondary | ICD-10-CM

## 2021-04-13 DIAGNOSIS — E785 Hyperlipidemia, unspecified: Secondary | ICD-10-CM | POA: Diagnosis not present

## 2021-04-13 DIAGNOSIS — L989 Disorder of the skin and subcutaneous tissue, unspecified: Secondary | ICD-10-CM | POA: Diagnosis not present

## 2021-04-13 NOTE — Telephone Encounter (Signed)
Perfect

## 2021-04-13 NOTE — Telephone Encounter (Signed)
Yes already scheduled.

## 2021-04-13 NOTE — Progress Notes (Signed)
Metrics: Intervention Frequency ACO  Documented Smoking Status Yearly  Screened one or more times in 24 months  Cessation Counseling or  Active cessation medication Past 24 months  Past 24 months   Guideline developer: UpToDate (See UpToDate for funding source) Date Released: 2014       Wellness Office Visit  Subjective:  Patient ID: Rachel Rojas, female    DOB: 12-May-1981  Age: 40 y.o. MRN: [id number]  CC: This lady comes in for follow-up of management of her PCOS, dyslipidemia, prediabetes and symptoms of hirsutism, related to the PCOS. HPI  She has tolerated spironolactone and she feels that the hirsutism is not a major problem as it used to be. She has tolerated higher dosing of NP thyroid. She says that for the last 2 weeks, she has had insomnia.  This has not been a previous problem.  She has tried alcohol, her partners sleep medications to no avail. She has managed to lose weight. She is also concerned about a area on the left anterior leg where there is itching and she has been scratching. Past Medical History:  Diagnosis Date  . PCOS (polycystic ovarian syndrome)    Past Surgical History:  Procedure Laterality Date  . CERVICAL SPINE SURGERY  12/02/2020   C5-7 [facility] Martyn Ehrich  . no surgery history       Family History  Problem Relation Age of Onset  . Hypertension Mother   . Lung cancer Maternal Uncle   . Diabetes Maternal Grandmother   . Bladder Cancer Maternal Grandmother     Social History   Social History Narrative   Engaged lives with boyfriend for 3 years.Previously married for 7 years.Self-employed Corporate treasurer.   Social History   Tobacco Use  . Smoking status: Former Research scientist (life sciences)  . Smokeless tobacco: Never Used  Substance Use Topics  . Alcohol use: Yes    Alcohol/week: 2.0 standard drinks    Types: 2 Glasses of wine per week    Comment: occassionally    Current Meds  Medication Sig  . Barberry-Oreg Grape-Goldenseal (BERBERINE COMPLEX PO)    . Cholecalciferol (CVS VIT D 5000 HIGH-POTENCY PO) Take 15,000 Units by mouth daily at 12 noon.  . Cinnamon 500 MG capsule Take 750 mg by mouth daily.   . Multiple Vitamins-Minerals (MULTI + OMEGA-3 ADULT GUMMIES) CHEW   . NP THYROID 90 MG tablet Take 1 tablet (90 mg total) by mouth daily.  . progesterone (PROMETRIUM) 200 MG capsule Take 2 capsules (400 mg total) by mouth daily.  Marland Kitchen spironolactone (ALDACTONE) 50 MG tablet Take 1 tablet (50 mg total) by mouth daily.     [place] Office Visit from 04/13/2021 in [facility]  PHQ-9 Total Score 12      Objective:   Today's Vitals: BP 140/81 (BP Location: Left Arm, Patient Position: Sitting, Cuff Size: Normal)   Pulse (!) 117   Temp (!) 97.5 F (36.4 C) (Temporal)   Resp 18   Ht 5\' 4"  (1.626 m)   Wt 198 lb 9.6 oz (90.1 kg)   SpO2 96%   BMI 34.09 kg/m  Vitals with BMI 04/13/2021 02/09/2021 12/22/2020  Height 5\' 4"  5\' 4"  5\' 4"   Weight 198 lbs 10 oz 208 lbs 214 lbs  BMI 34.07 71.24 58.09  Systolic 983 382 505  Diastolic 81 70 80  Pulse 397 88 78     Physical Exam  She has lost 10 pounds since last time I saw her.  Blood pressure today  elevated systolically. I can see scratch marks on the left anterior leg lower but I am not sure what this represents.  I do not see a specific lesion.    Assessment   1. PCOS (polycystic ovarian syndrome)   2. Dyslipidemia   3. Prediabetes   4. Skin lesion of left leg       Tests ordered Orders Placed This Encounter  Procedures  . T3, free  . TSH  . Lipid panel  . Hemoglobin A1c  . Basic metabolic panel  . Ambulatory referral to Dermatology     Plan: 1. She will continue with NP thyroid and we will check thyroid function to see if we need to optimize further. 2. She will continue with spironolactone and I will check electrolytes today. 3. Check lipid panel and hemoglobin A1c to see if there is any improvement. 4. I will refer to dermatology for the pruritus on the  left lower leg. 5. Follow-up in 2 months to see how she is doing.   No orders of the defined types were placed in this encounter.   Doree Albee, MD

## 2021-04-13 NOTE — Telephone Encounter (Signed)
[employer] prior authization submitted online;Authorization is not required

## 2021-04-13 NOTE — Telephone Encounter (Signed)
So you can schedule the pelvic ultrasound?

## 2021-04-14 LAB — BASIC METABOLIC PANEL
BUN: 14 mg/dL (ref 7–25)
CO2: 24 mmol/L (ref 20–32)
Calcium: 9.9 mg/dL (ref 8.6–10.2)
Chloride: 102 mmol/L (ref 98–110)
Creat: 0.78 mg/dL (ref 0.50–1.10)
Glucose, Bld: 104 mg/dL (ref 65–139)
Potassium: 4 mmol/L (ref 3.5–5.3)
Sodium: 138 mmol/L (ref 135–146)

## 2021-04-14 LAB — HEMOGLOBIN A1C
Hgb A1c MFr Bld: 5.6 % of total Hgb (ref <5.7)
Mean Plasma Glucose: 114 mg/dL
eAG (mmol/L): 6.3 mmol/L

## 2021-04-14 LAB — T3, FREE: T3, Free: 5.5 pg/mL — ABNORMAL HIGH (ref 2.3–4.2)

## 2021-04-14 LAB — LIPID PANEL
Cholesterol: 252 mg/dL — ABNORMAL HIGH (ref <200)
HDL: 56 mg/dL (ref >=50)
LDL Cholesterol (Calc): 165 mg/dL (calc) — ABNORMAL HIGH
Non-HDL Cholesterol (Calc): 196 mg/dL (calc) — ABNORMAL HIGH (ref <130)
Total CHOL/HDL Ratio: 4.5 (calc) (ref <5.0)
Triglycerides: 165 mg/dL — ABNORMAL HIGH (ref <150)

## 2021-04-14 LAB — TSH: TSH: 2.21 mIU/L

## 2021-04-24 ENCOUNTER — Ambulatory Visit (HOSPITAL_COMMUNITY): Payer: 59

## 2021-05-11 ENCOUNTER — Other Ambulatory Visit (INDEPENDENT_AMBULATORY_CARE_PROVIDER_SITE_OTHER): Payer: Self-pay | Admitting: Internal Medicine

## 2021-06-01 ENCOUNTER — Ambulatory Visit (INDEPENDENT_AMBULATORY_CARE_PROVIDER_SITE_OTHER): Payer: 59 | Admitting: Internal Medicine

## 2021-06-28 ENCOUNTER — Encounter (INDEPENDENT_AMBULATORY_CARE_PROVIDER_SITE_OTHER): Payer: Self-pay | Admitting: Internal Medicine

## 2021-08-01 ENCOUNTER — Encounter (INDEPENDENT_AMBULATORY_CARE_PROVIDER_SITE_OTHER): Payer: Self-pay | Admitting: Nurse Practitioner

## 2021-08-02 ENCOUNTER — Other Ambulatory Visit (INDEPENDENT_AMBULATORY_CARE_PROVIDER_SITE_OTHER): Payer: Self-pay | Admitting: Nurse Practitioner

## 2021-08-02 DIAGNOSIS — R5383 Other fatigue: Secondary | ICD-10-CM

## 2021-08-02 DIAGNOSIS — E282 Polycystic ovarian syndrome: Secondary | ICD-10-CM

## 2021-08-02 MED ORDER — THYROID 60 MG PO TABS: 60.0000 mg | ORAL_TABLET | Freq: Every day | ORAL | 0 refills | Status: DC

## 2021-08-02 MED ORDER — SPIRONOLACTONE 50 MG PO TABS: 50.0000 mg | ORAL_TABLET | Freq: Every day | ORAL | 1 refills | Status: DC

## 2021-08-02 MED ORDER — PROGESTERONE 200 MG PO CAPS: 400.0000 mg | ORAL_CAPSULE | Freq: Every day | ORAL | 0 refills | Status: DC

## 2021-08-03 ENCOUNTER — Other Ambulatory Visit (INDEPENDENT_AMBULATORY_CARE_PROVIDER_SITE_OTHER): Payer: Self-pay | Admitting: Nurse Practitioner

## 2021-08-03 DIAGNOSIS — R7989 Other specified abnormal findings of blood chemistry: Secondary | ICD-10-CM

## 2021-08-03 DIAGNOSIS — R5383 Other fatigue: Secondary | ICD-10-CM

## 2021-08-03 MED ORDER — THYROID 60 MG PO TABS: 60.0000 mg | ORAL_TABLET | Freq: Every day | ORAL | 0 refills | Status: DC

## 2021-11-17 ENCOUNTER — Other Ambulatory Visit (INDEPENDENT_AMBULATORY_CARE_PROVIDER_SITE_OTHER): Payer: Self-pay | Admitting: Nurse Practitioner

## 2021-11-17 DIAGNOSIS — E282 Polycystic ovarian syndrome: Secondary | ICD-10-CM

## 2022-01-26 ENCOUNTER — Other Ambulatory Visit (INDEPENDENT_AMBULATORY_CARE_PROVIDER_SITE_OTHER): Payer: Self-pay | Admitting: Nurse Practitioner

## 2022-01-26 DIAGNOSIS — R5383 Other fatigue: Secondary | ICD-10-CM

## 2022-08-15 ENCOUNTER — Encounter: Payer: Self-pay | Admitting: Gastroenterology

## 2022-10-17 ENCOUNTER — Encounter: Payer: Self-pay | Admitting: Gastroenterology

## 2022-10-17 ENCOUNTER — Other Ambulatory Visit (INDEPENDENT_AMBULATORY_CARE_PROVIDER_SITE_OTHER): Payer: Commercial Managed Care - HMO

## 2022-10-17 ENCOUNTER — Ambulatory Visit: Payer: Commercial Managed Care - HMO | Admitting: Gastroenterology

## 2022-10-17 VITALS — BP 136/80 | HR 110 | Ht 64.0 in | Wt 207.4 lb

## 2022-10-17 DIAGNOSIS — K219 Gastro-esophageal reflux disease without esophagitis: Secondary | ICD-10-CM | POA: Diagnosis not present

## 2022-10-17 DIAGNOSIS — R194 Change in bowel habit: Secondary | ICD-10-CM | POA: Diagnosis not present

## 2022-10-17 DIAGNOSIS — R109 Unspecified abdominal pain: Secondary | ICD-10-CM

## 2022-10-17 DIAGNOSIS — K625 Hemorrhage of anus and rectum: Secondary | ICD-10-CM

## 2022-10-17 DIAGNOSIS — Z791 Long term (current) use of non-steroidal anti-inflammatories (NSAID): Secondary | ICD-10-CM

## 2022-10-17 DIAGNOSIS — R140 Abdominal distension (gaseous): Secondary | ICD-10-CM

## 2022-10-17 LAB — CBC WITH DIFFERENTIAL/PLATELET
Basophils Absolute: 0 10*3/uL (ref 0.0–0.1)
Basophils Relative: 0.5 % (ref 0.0–3.0)
Eosinophils Absolute: 0.3 10*3/uL (ref 0.0–0.7)
Eosinophils Relative: 4.4 % (ref 0.0–5.0)
HCT: 40.8 % (ref 36.0–46.0)
Hemoglobin: 13.9 g/dL (ref 12.0–15.0)
Lymphocytes Relative: 41.4 % (ref 12.0–46.0)
Lymphs Abs: 3.2 10*3/uL (ref 0.7–4.0)
MCHC: 34.2 g/dL (ref 30.0–36.0)
MCV: 88.8 fl (ref 78.0–100.0)
Monocytes Absolute: 0.5 10*3/uL (ref 0.1–1.0)
Monocytes Relative: 6.4 % (ref 3.0–12.0)
Neutro Abs: 3.7 10*3/uL (ref 1.4–7.7)
Neutrophils Relative %: 47.3 % (ref 43.0–77.0)
Platelets: 314 10*3/uL (ref 150.0–400.0)
RBC: 4.59 Mil/uL (ref 3.87–5.11)
RDW: 13.2 % (ref 11.5–15.5)
WBC: 7.7 10*3/uL (ref 4.0–10.5)

## 2022-10-17 LAB — IBC + FERRITIN
Ferritin: 22.2 ng/mL (ref 10.0–291.0)
Iron: 63 ug/dL (ref 42–145)
Saturation Ratios: 17.8 % — ABNORMAL LOW (ref 20.0–50.0)
TIBC: 354.2 ug/dL (ref 250.0–450.0)
Transferrin: 253 mg/dL (ref 212.0–360.0)

## 2022-10-17 MED ORDER — OMEPRAZOLE 20 MG PO CPDR: 20.0000 mg | DELAYED_RELEASE_CAPSULE | Freq: Every day | ORAL | 3 refills | Status: DC

## 2022-10-17 MED ORDER — CITRUCEL PO POWD: 1.0000 | Freq: Every day | ORAL | Status: DC

## 2022-10-17 MED ORDER — NA SULFATE-K SULFATE-MG SULF 17.5-3.13-1.6 GM/177ML PO SOLN: 1.0000 | Freq: Once | ORAL | 0 refills | Status: AC

## 2022-10-17 MED ORDER — DICYCLOMINE HCL 10 MG PO CAPS: 10.0000 mg | ORAL_CAPSULE | Freq: Three times a day (TID) | ORAL | 3 refills | Status: DC | PRN

## 2022-10-17 NOTE — Patient Instructions (Signed)
If you are age 43 or older, your body mass index should be between 23-30. Your Body mass index is 35.6 kg/m. If this is out of the aforementioned range listed, please consider follow up with your Primary Care Provider.  If you are age 27 or younger, your body mass index should be between 19-25. Your Body mass index is 35.6 kg/m. If this is out of the aformentioned range listed, please consider follow up with your Primary Care Provider.   ________________________________________________________  Rachel Rojas have been scheduled for an endoscopy and colonoscopy. Please follow the written instructions given to you at your visit today. Please pick up your prep supplies at the pharmacy within the next 1-3 days. If you use inhalers (even only as needed), please bring them with you on the day of your procedure.  Please go to the lab in the basement of our building to have lab work done as you leave today. Hit "B" for basement when you get on the elevator.  When the doors open the lab is on your left.  We will call you with the results. Thank you.  We are giving you a Low-FODMAP diet handout today. FODMAPs are short-chain carbohydrates (sugars) that are highly fermentable, which means that they go through chemical changes in the GI system, and are poorly absorbed during digestion. When FODMAPs reach the colon (large intestine), bacteria ferment these sugars, turning them into gas and chemicals. This stretches the walls of the colon, causing abdominal bloating, distension, cramping, pain, and/or changes in bowel habits in many patients with IBS. FODMAPs are not unhealthy or harmful, but may exacerbate GI symptoms in those with sensitive GI tracts.  Please purchase the following medications over the counter and take as directed: Citrucel: Once daily  We have sent the following medications to your pharmacy for you to pick up at your convenience: Bentyl: Take every 8 hours as needed for abdominal pain Omeprazole 20  mg: Take once daily  Avoid NSAID use.  Thank you for entrusting me with your care and for choosing [facility], Dr.  Cellar

## 2022-10-17 NOTE — Progress Notes (Signed)
HPI :  41 year old female with a history of arthritis, PCOS, spinal stenosis, self-referred here for further evaluation of altered bowel habits, abdominal pains.  She states she has had altered bowel habits for the past year or 2 or so.  She describes urgent loose stools at times after she eats, often within 5 minutes.  She typically will have 1-2 bowel movements per day.  Other times she has constipation and has a hard time moving her bowels.  She states she tends to go back and forth between the 2 but the loose stools seem to be more common in the constipated stools.  For the past year she has been experiencing bright red blood on the toilet paper when she wipes herself as well as in her stool.  She denies any rectal pain with this.  She does have abdominal pains that bother her.  She has pains in her lower abdomen when she is moving her bowels.  She also describes some upper abdominal pains that bother her as well.  Upper abdomen can be painful and also be quite bloated and distended which bothers her.  She endorses chronic joint pain for which she has used ibuprofen on a routine basis.  More recently has been using meloxicam daily.  She states this works well to control her joint pains but she does often have cramping in her abdomen.  She also has a history of reflux for the past few months that has been bothering her.  She has pyrosis and regurgitation, often after she eats.  Is been using Tums as needed.  Has not tried anything else.  No dysphagia.  She has never had a prior EGD or colonoscopy.  No family history of colon cancer or colitis.  She is concerned about her possibility for ulcers given her NSAID use.  No unintentional weight loss.  BMI currently 35.6.  She has not tried anything for her bowels yet.  She has not had lab work since 2022 that I can see in the system.  Past Medical History:  Diagnosis Date   Cervical radiculopathy    Osteoarthritis of right knee    PCOS (polycystic  ovarian syndrome)    Spinal stenosis, cervical region      Past Surgical History:  Procedure Laterality Date   CERVICAL SPINE SURGERY  12/02/2020   C5-7 [facility] Martyn Ehrich   Family History  Problem Relation Age of Onset   Colon polyps Mother    Hypertension Mother    Lung cancer Maternal Uncle    Stomach cancer Maternal Grandmother    Diabetes Maternal Grandmother    Bladder Cancer Maternal Grandmother    Social History   Tobacco Use   Smoking status: Former   Smokeless tobacco: Never  Vaping Use   Vaping Use: Former  Substance Use Topics   Alcohol use: Yes    Alcohol/week: 2.0 standard drinks of alcohol    Types: 2 Glasses of wine per week    Comment: occassionally   Drug use: No   Current Outpatient Medications  Medication Sig Dispense Refill   Barberry-Oreg Grape-Goldenseal (BERBERINE COMPLEX PO)      buPROPion (WELLBUTRIN XL) 300 MG 24 hr tablet TAKE 1 TABLET BY MOUTH EVERYDAY AT BEDTIME     Cholecalciferol (CVS VIT D 5000 HIGH-POTENCY PO) Take 15,000 Units by mouth daily at 12 noon.     Cinnamon 500 MG capsule Take 750 mg by mouth daily.      clonazePAM (KLONOPIN) 0.5 MG  tablet Take 1 tablet by mouth 2 (two) times daily.     gabapentin (NEURONTIN) 300 MG capsule Take by mouth.     meloxicam (MOBIC) 15 MG tablet Take by mouth.     Multiple Vitamins-Minerals (MULTI + OMEGA-3 ADULT GUMMIES) CHEW      No current facility-administered medications for this visit.   Allergies  Allergen Reactions   Wound Dressing Adhesive Rash and Itching     Review of Systems: All systems reviewed and negative except where noted in HPI.   Last labs in system 2022 reviewed   Physical Exam: BP 136/80   Pulse (!) 110   Ht '5\' 4"'$  (1.626 m)   Wt 207 lb 6.4 oz (94.1 kg)   SpO2 97%   BMI 35.60 kg/m  Constitutional: Pleasant,well-developed, female in no acute distress. HEENT: Normocephalic and atraumatic. Conjunctivae are normal. No scleral icterus. Neck supple.   Cardiovascular: Normal rate, regular rhythm.  Pulmonary/chest: Effort normal and breath sounds normal.  Abdominal: Soft, nondistended, nontender. There are no masses palpable. No hepatomegaly. Extremities: no edema Lymphadenopathy: No cervical adenopathy noted. Neurological: Alert and oriented to person place and time. Skin: Skin is warm and dry. No rashes noted. Psychiatric: Normal mood and affect. Behavior is normal.   ASSESSMENT: 41 y.o. female here for assessment of the following  1. Altered bowel habits   2. Rectal bleeding   3. Abdominal pain, unspecified abdominal location   4. Gastroesophageal reflux disease, unspecified whether esophagitis present   5. Bloating   6. NSAID long-term use    History as above, altered bowel habits with intermittent rectal bleeding over the past year or 2.  She also has lower abdominal pain associated with her bowel movements and poorly controlled reflux with upper abdominal pain as well.  She also has significant gas and bloating.  Discussed differential diagnosis with her.  She has frequent NSAID use, at risk for PUD.  Otherwise possible she has a functional bowel disorder however with her periodic rectal bleeding, recommend colonoscopy to ensure no evidence of polyp/mass lesion, IBD etc.  Recommend EGD and colonoscopy to evaluate the symptoms.  I discussed risk benefits of the procedures and anesthesia and she wants to proceed.  I counseled her on long-term NSAID use, do not recommend it given her symptoms, at risk for PUD.  Recommend using Tylenol in its place of NSAIDs for now while she is feeling poorly.  We will start omeprazole 20 mg once daily to treat her GERD and potentially treat any underlying gastritis or PUD.  Regarding her bloating and bowel changes, given alternating stool form, recommend Citrucel once daily to provide some regularity.  Counseled her on a low FODMAP diet and handout given to see if this can reduce some amount of gas and  loose stools she has.  Also will give her some Bentyl 10 mg every 8 hours as needed for abdominal cramping.  We will also send her to the lab for CBC today to make sure no anemia, check iron studies, also screen for celiac disease.  Further recommendations pending the results and her course, she agrees with the plan as outlined.   PLAN: - schedule EGD and colonoscopy at the [facility] - lab for CBC, TIBC / ferritin, and TTG IgA / IgA - low FODMAP diet handout given - Citrucel once daily - Bentyl '10mg'$  every 8 hours PRN #30 RF3 - Omeprazole '20mg'$  / day - minimize NSAID use, try tylenol instead  Jolly Mango, MD [facility]

## 2022-10-18 LAB — TISSUE TRANSGLUTAMINASE, IGA: (tTG) Ab, IgA: <1 U/mL

## 2022-10-18 LAB — IGA: Immunoglobulin A: 204 mg/dL (ref 47–310)

## 2022-11-07 ENCOUNTER — Telehealth: Payer: Self-pay | Admitting: Gastroenterology

## 2022-11-07 NOTE — Telephone Encounter (Signed)
Based on [employer] Gastrointestinal Endoscopic Procedure Guidelines - Esophagogastroduodenoscopy (EGD) Section(s): EGD 1.1: Dyspepsia/Upper  Abdominal Symptoms, we cannot approve this request. Your healthcare  provider told us that there is a concern for disease in your stomach. The  request cannot be approved because: It must be needed for one of the following.   -Failed four week trial of daily proton pump inhibitor therapy, or  treatment of found Helicobacter pylori (stomach infection).  -A parent, sibling, or child with history of cancer in the upper portion  of the path food travels through your body including esophagus, stomach,  and duodenum (upper gastrointestinal or UGI tract).  -Unintended weight loss (more than five percent) in the past six to 12  months.  -Suspected UGI bleeding based on blood tests, exam, and/or history.  -A lack of healthy red blood cells with iron (iron-deficiency anemia),  thought to have started in the UGI tract.  -Difficult swallowing.  -Chest pain when swallowing.  -Vomiting for at least seven days in a row with no known cause.  -Abnormal prior UGI imaging suggests disease causing a physical change in  an organ or part (organic disease).  -Suspected cancer based on history and exam.  -Mass (lump) in your abdomen or swollen glands (lymph nodes) felt by your  doctor.   Please advise if a peer to peer needs to be scheduled or reconsideration

## 2022-11-11 NOTE — Telephone Encounter (Signed)
[place] can you please relay the following to the patient: - she is schedule for EGD and colonoscopy in the next few days - can keep appointment for colonoscopy - insurance is initially denying request for EGD for upper abdominal pain, history of NSAID use, GERD - had started omeprazole '20mg'$  / day 4 weeks ago. If her symptoms persist despite 4 weeks of PPI, looks like insurance will approve the exam.   Herbert Seta can you see how she is doing? If her symptoms persist despite omeprazole, then I think she would be cleared for EGD and should proceed. If her symptoms have resolved, could then hold off on EGD. Can you let me know? Thanks

## 2022-11-12 NOTE — Telephone Encounter (Signed)
Okay thanks.  We should tell her insurance that she has failed PPI for 4 weeks, hopefully will be approved to proceed with the exam as scheduled.  Thanks

## 2022-11-12 NOTE — Telephone Encounter (Signed)
Called and spoke with patient regarding information below. Pt states that she still has symptoms off and on despite being on the Omeprazole. Pt states that this is the last year that she will have insurance and really wants to proceed with EGD.

## 2022-11-13 ENCOUNTER — Ambulatory Visit (AMBULATORY_SURGERY_CENTER): Payer: Commercial Managed Care - HMO | Admitting: Gastroenterology

## 2022-11-13 ENCOUNTER — Encounter: Payer: Self-pay | Admitting: Gastroenterology

## 2022-11-13 VITALS — BP 149/84 | HR 83 | Temp 98.0°F | Resp 12 | Ht 64.0 in | Wt 207.0 lb

## 2022-11-13 DIAGNOSIS — D122 Benign neoplasm of ascending colon: Secondary | ICD-10-CM | POA: Diagnosis present

## 2022-11-13 DIAGNOSIS — R194 Change in bowel habit: Secondary | ICD-10-CM

## 2022-11-13 DIAGNOSIS — K219 Gastro-esophageal reflux disease without esophagitis: Secondary | ICD-10-CM | POA: Diagnosis not present

## 2022-11-13 DIAGNOSIS — R109 Unspecified abdominal pain: Secondary | ICD-10-CM

## 2022-11-13 DIAGNOSIS — K319 Disease of stomach and duodenum, unspecified: Secondary | ICD-10-CM | POA: Diagnosis not present

## 2022-11-13 DIAGNOSIS — K625 Hemorrhage of anus and rectum: Secondary | ICD-10-CM

## 2022-11-13 DIAGNOSIS — K2210 Ulcer of esophagus without bleeding: Secondary | ICD-10-CM

## 2022-11-13 MED ORDER — OMEPRAZOLE 20 MG PO CPDR: 20.0000 mg | DELAYED_RELEASE_CAPSULE | Freq: Every day | ORAL | 3 refills | Status: AC

## 2022-11-13 NOTE — Telephone Encounter (Signed)
Notes from below and last office note faxed to [facility] urgently with patient's information and case #.

## 2022-11-13 NOTE — Progress Notes (Signed)
Called to room to assist during endoscopic procedure.  Patient ID and intended procedure confirmed with present staff. Received instructions for my participation in the procedure from the performing physician.  

## 2022-11-13 NOTE — Op Note (Signed)
[place] Patient Name: Rachel Rojas Procedure Date: 11/13/2022 4:14 PM MRN: [id number] Endoscopist: Remo Lipps P. Havery Moros , MD, [id number] Age: 41 Referring MD:  Date of Birth: 01-24-1981 Gender: Female Account #: [id number] Procedure:                Upper GI endoscopy Indications:              Abdominal pain, ongoing gastro-esophageal reflux                            disease despite 4 week trial of omeprazole '20mg'$  /                            day, history of NSAID use for neck pain Medicines:                Monitored Anesthesia Care Procedure:                Pre-Anesthesia Assessment:                           - Prior to the procedure, a History and Physical                            was performed, and patient medications and                            allergies were reviewed. The patient's tolerance of                            previous anesthesia was also reviewed. The risks                            and benefits of the procedure and the sedation                            options and risks were discussed with the patient.                            All questions were answered, and informed consent                            was obtained. Prior Anticoagulants: The patient has                            taken no anticoagulant or antiplatelet agents. ASA                            Grade Assessment: II - A patient with mild systemic                            disease. After reviewing the risks and benefits,                            the patient was deemed in satisfactory condition to  undergo the procedure.                           After obtaining informed consent, the endoscope was                            passed under direct vision. Throughout the                            procedure, the patient's blood pressure, pulse, and                            oxygen saturations were monitored continuously. The                            GIF  [id number] #[id number] was introduced through the                            mouth, and advanced to the second part of duodenum.                            The upper GI endoscopy was accomplished without                            difficulty. The patient tolerated the procedure                            well. Scope In: Scope Out: Findings:                 Esophagogastric landmarks were identified: the                            Z-line was found at 39 cm, the gastroesophageal                            junction was found at 39 cm and the upper extent of                            the gastric folds was found at 40 cm from the                            incisors.                           A 1 cm hiatal hernia was present.                           LA Grade B esophagitis was found at the GEJ.                           The exam of the esophagus was otherwise normal.                           Patchy mildly erythematous mucosa was found  in the                            gastric antrum.                           The exam of the stomach was otherwise normal.                           Biopsies were taken with a cold forceps in the                            gastric body, at the incisura and in the gastric                            antrum for Helicobacter pylori testing given                            gastric erythema and ongoing NSAID use.                           The examined duodenum was normal. Complications:            No immediate complications. Estimated blood loss:                            Minimal. Estimated Blood Loss:     Estimated blood loss was minimal. Impression:               - Esophagogastric landmarks identified.                           - 1 cm hiatal hernia.                           - LA Grade B reflux esophagitis.                           - Normal esophagus                           - Erythematous mucosa in the antrum.                           - Normal stomach otherwise -  biopsies taken to rule                            out H pylori                           - Normal examined duodenum.                           Patient has erosive esophagitis despite low dose                            PPI, recommend escalation of omeprazole dosing Recommendation:           -  Patient has a contact number available for                            emergencies. The signs and symptoms of potential                            delayed complications were discussed with the                            patient. Return to normal activities tomorrow.                            Written discharge instructions were provided to the                            patient.                           - Resume previous diet.                           - Continue present medications.                           - Increase omeprazole to '20mg'$  twice daily for 4                            weeks, then use lowest daily dose needed to control                            symptoms                           - Minimize NSAID use if possible                           - Await pathology results. Remo Lipps P. Beyonce Sawatzky, MD 11/13/2022 4:54:17 PM This report has been signed electronically.

## 2022-11-13 NOTE — Op Note (Signed)
[place] Patient Name: Rachel Rojas Procedure Date: 11/13/2022 4:00 PM MRN: [id number] Endoscopist: Remo Lipps P. Havery Moros , MD, [id number] Age: 41 Referring MD:  Date of Birth: 02-10-1981 Gender: Female Account #: [id number] Procedure:                Colonoscopy Indications:              Rectal bleeding, Abdominal pain, Change in bowel                            habits - alternating constipation / loose stool Medicines:                Monitored Anesthesia Care Procedure:                Pre-Anesthesia Assessment:                           - Prior to the procedure, a History and Physical                            was performed, and patient medications and                            allergies were reviewed. The patient's tolerance of                            previous anesthesia was also reviewed. The risks                            and benefits of the procedure and the sedation                            options and risks were discussed with the patient.                            All questions were answered, and informed consent                            was obtained. Prior Anticoagulants: The patient has                            taken no anticoagulant or antiplatelet agents. ASA                            Grade Assessment: II - A patient with mild systemic                            disease. After reviewing the risks and benefits,                            the patient was deemed in satisfactory condition to                            undergo the procedure.  After obtaining informed consent, the colonoscope                            was passed under direct vision. Throughout the                            procedure, the patient's blood pressure, pulse, and                            oxygen saturations were monitored continuously. The                            Olympus PCF-[id number] (#[id number]) Colonoscope was                             introduced through the anus and advanced to the the                            terminal ileum, with identification of the                            appendiceal orifice and IC valve. The colonoscopy                            was performed without difficulty. The patient                            tolerated the procedure well. The quality of the                            bowel preparation was good. The terminal ileum,                            ileocecal valve, appendiceal orifice, and rectum                            were photographed. Scope In: 4:22:45 PM Scope Out: 4:41:24 PM Scope Withdrawal Time: 0 hours 10 minutes 41 seconds  Total Procedure Duration: 0 hours 18 minutes 39 seconds  Findings:                 The perianal and digital rectal examinations were                            normal.                           The terminal ileum appeared normal.                           A 10 mm polyp was found in the ascending colon. The                            polyp was sessile. The polyp was removed with a  cold snare. Resection and retrieval were complete.                           Internal hemorrhoids were found during                            retroflexion. The hemorrhoids were small.                           The exam was otherwise without abnormality. No                            inflammatory changes Complications:            No immediate complications. Estimated blood loss:                            Minimal. Estimated Blood Loss:     Estimated blood loss was minimal. Impression:               - The examined portion of the ileum was normal.                           - One 10 mm polyp in the ascending colon, removed                            with a cold snare. Resected and retrieved.                           - Internal hemorrhoids.                           - The examination was otherwise normal.                           Suspect hemorrhoids are the  likely cause of                            bleeding symptoms. No evidence of Crohn's colitis,                            etc. Recommendation:           - Patient has a contact number available for                            emergencies. The signs and symptoms of potential                            delayed complications were discussed with the                            patient. Return to normal activities tomorrow.                            Written discharge instructions were provided to the  patient.                           - Resume previous diet.                           - Continue present medications.                           - Recommend daily Citrucel                           - Trial of Bentyl as needed, as discussed in the                            office                           - Await pathology results.                           - Follow up in the office in a few months for                            reassessment and discussion of long term plan Remo Lipps P. Yehuda Printup, MD 11/13/2022 4:48:14 PM This report has been signed electronically.

## 2022-11-13 NOTE — Patient Instructions (Addendum)
- Patient has a contact number available for emergencies. The signs and symptoms of potential delayed complications were discussed with the patient. Return to normal activities tomorrow. Written discharge instructions were provided to the patient. - Resume previous diet. - Continue present medications. - Recommend daily Citrucel - Trial of Bentyl as needed, as discussed in the office - Await pathology results. - Follow up in the office in a few months for reassessment and discussion of long term Plan. - Patient has a contact number available for emergencies. The signs and symptoms of potential delayed complications were discussed with the patient. Return to normal activities tomorrow. Written discharge instructions were provided to the patient. - Resume previous diet. - Continue present medications. - Increase omeprazole to '20mg'$  twice daily for 4 weeks, then use lowest daily dose needed to control symptoms - Minimize NSAID use if possible - Await pathology results.  Handouts on Esophagitis and polyps given.   YOU HAD AN ENDOSCOPIC PROCEDURE TODAY AT [facility]:   Refer to the procedure report that was given to you for any specific questions about what was found during the examination.  If the procedure report does not answer your questions, please call your gastroenterologist to clarify.  If you requested that your care partner not be given the details of your procedure findings, then the procedure report has been included in a sealed envelope for you to review at your convenience later.  YOU SHOULD EXPECT: Some feelings of bloating in the abdomen. Passage of more gas than usual.  Walking can help get rid of the air that was put into your GI tract during the procedure and reduce the bloating. If you had a lower endoscopy (such as a colonoscopy or flexible sigmoidoscopy) you may notice spotting of blood in your stool or on the toilet paper. If you underwent a bowel prep for  your procedure, you may not have a normal bowel movement for a few days.  Please Note:  You might notice some irritation and congestion in your nose or some drainage.  This is from the oxygen used during your procedure.  There is no need for concern and it should clear up in a day or so.  SYMPTOMS TO REPORT IMMEDIATELY:  Following lower endoscopy (colonoscopy or flexible sigmoidoscopy):  Excessive amounts of blood in the stool  Significant tenderness or worsening of abdominal pains  Swelling of the abdomen that is new, acute  Fever of 100F or higher  Following upper endoscopy (EGD)  Vomiting of blood or coffee ground material  New chest pain or pain under the shoulder blades  Painful or persistently difficult swallowing  New shortness of breath  Fever of 100F or higher  Black, tarry-looking stools  For urgent or emergent issues, a gastroenterologist can be reached at any hour by calling [phone]. Do not use MyChart messaging for urgent concerns.    DIET:  We do recommend a small meal at first, but then you may proceed to your regular diet.  Drink plenty of fluids but you should avoid alcoholic beverages for 24 hours.  ACTIVITY:  You should plan to take it easy for the rest of today and you should NOT DRIVE or use heavy machinery until tomorrow (because of the sedation medicines used during the test).    FOLLOW UP: Our staff will call the number listed on your records the next business day following your procedure.  We will call around 7:15- 8:00 am to check on you and  address any questions or concerns that you may have regarding the information given to you following your procedure. If we do not reach you, we will leave a message.     If any biopsies were taken you will be contacted by phone or by letter within the next 1-3 weeks.  Please call us at [phone] if you have not heard about the biopsies in 3 weeks.    SIGNATURES/CONFIDENTIALITY: You and/or your care  partner have signed paperwork which will be entered into your electronic medical record.  These signatures attest to the fact that that the information above on your After Visit Summary has been reviewed and is understood.  Full responsibility of the confidentiality of this discharge information lies with you and/or your care-partner.

## 2022-11-13 NOTE — Progress Notes (Signed)
Pt's states no medical or surgical changes since previsit or office visit. 

## 2022-11-13 NOTE — Progress Notes (Signed)
A/ox3, pleased with MAC, report to RN 

## 2022-11-13 NOTE — Telephone Encounter (Signed)
Yes,  I spoke with a provider services rep and was informed additional information regarding this case would need to be faxed over for further review. Pt's name and DOB along with the case number will need to be included in the cover sheet, thank you!  Case# [id number] Fax: [phone]

## 2022-11-13 NOTE — Progress Notes (Signed)
History and Physical Interval Note: Patient seen on 10/17/22. Plan for EGD and colonoscopy to further evaluate symptoms as outlined. I had given her a trial of omeprazole '20mg'$  / day for 4 weeks but symptoms persist. Also given citrucel for her bowels and bentyl to use PRN but she has not tried that yet. Have discussed the procedures, risks, with her, she wishes to proceed. Of note, ongoing neck pain and has not been able to stop NSAIDs.     11/13/2022 4:02 PM  Rachel Rojas  has presented today for endoscopic procedure(s), with the diagnosis of  Encounter Diagnoses  Name Primary?   Altered bowel habits Yes   Rectal bleeding    Abdominal pain, unspecified abdominal location    Gastroesophageal reflux disease, unspecified whether esophagitis present   .  The various methods of evaluation and treatment have been discussed with the patient and/or family. After consideration of risks, benefits and other options for treatment, the patient has consented to  the endoscopic procedure(s).   The patient's history has been reviewed, patient examined, no change in status, stable for surgery.  I have reviewed the patient's chart and labs.  Questions were answered to the patient's satisfaction.    Jolly Mango, MD [facility]

## 2022-11-14 ENCOUNTER — Telehealth: Payer: Self-pay

## 2022-11-14 NOTE — Telephone Encounter (Signed)
No answer, left message to call if having any issues or concerns, B.Imri Lor RN 

## 2022-11-14 NOTE — Telephone Encounter (Signed)
Per 11/28 procedure report - F/U in the office in a few months for reassessment and discussion long term plan  Patient has been scheduled for a f/u appt with Dr. Havery Moros on Tuesday, 01/08/23 at 1:40 pm.  Appt information mailed and sent to patient via [place].

## 2023-01-08 ENCOUNTER — Ambulatory Visit: Payer: Commercial Managed Care - HMO | Admitting: Gastroenterology

## 2023-03-21 ENCOUNTER — Telehealth: Payer: Self-pay

## 2023-03-21 NOTE — Telephone Encounter (Signed)
Pt calling states that she needs dr cherry to send in the progesterone bc to stop her bleeding. She's been bleeding irregular for 3 weeks because of her pcos. Pt aware cherry is out of office until next week. She can not get in to see [place] until 4/24 Please advise

## 2023-03-22 ENCOUNTER — Encounter: Payer: Self-pay | Admitting: Obstetrics and Gynecology

## 2023-03-22 ENCOUNTER — Other Ambulatory Visit: Payer: Self-pay

## 2023-03-22 MED ORDER — SLYND 4 MG PO TABS: 1.0000 | ORAL_TABLET | Freq: Every day | ORAL | 0 refills | Status: DC

## 2023-04-11 NOTE — Progress Notes (Unsigned)
GYNECOLOGY PROGRESS NOTE  Subjective:    Patient ID: Rachel Rojas, female    DOB: 04-May-1981, 42 y.o.   MRN: [id number]  HPI  Patient is a 42 y.o. G15P1011 female who presents for management of menorrhagia.Has a history of PCOS and hypothyroidism. Patient has had off and on issues with menorrhagia over the past several years. Last seen by GYN in 2020.  At that time, was given supplemental progesterone which helped her cycles. After this she was being managed by PCP who passed away (holistic medicine).  Was on supplemental hormones at  that time.  Notes normal cycles for 3-4 months after stopping hormones as she ran out of her prescription.  Then had an episode of bleeding in March (beginning 03/01/2023) that went on until 03/20/2023 for 3 weeks.  Was prescribed Slynd to help with the bleeding until she could schedule an appointment in the office, notes bleeding stopped but now only comes at night, light for the past 5 days.  Has also initiated use of bio-identical hormones this week. Is at end of her pill pack for the [facility].    Menstrual History: OB History     Gravida  2   Para  1   Term  1   Preterm      AB  1   Living  1      SAB      IAB      Ectopic      Multiple      Live Births  1           Menarche age: 39 No LMP recorded. (Menstrual status: Irregular Periods). Period Pattern: (!) Irregular Menstrual Flow: Heavy Menstrual Control: Maxi pad, Tampon Menstrual Control Change Freq (Hours): 8 Dysmenorrhea: (!) Mild   OB History  Gravida Para Term Preterm AB Living  2 1 1   1 1   SAB IAB Ectopic Multiple Live Births          1    # Outcome Date GA Lbr Len/2nd Weight Sex Delivery Anes PTL Lv  2 Term 02/21/99    M Vag-Spont   LIV  1 AB             Past Medical History:  Diagnosis Date   Cervical radiculopathy    Osteoarthritis of right knee    PCOS (polycystic ovarian syndrome)    Spinal stenosis, cervical region     Family History  Problem  Relation Age of Onset   Colon polyps Mother    Hypertension Mother    Lung cancer Maternal Uncle    Stomach cancer Maternal Grandmother    Diabetes Maternal Grandmother    Bladder Cancer Maternal Grandmother     Past Surgical History:  Procedure Laterality Date   CERVICAL SPINE SURGERY  12/02/2020   C[address] [place]    Social History   Socioeconomic History   Marital status: Divorced    Spouse name: Not on file   Number of children: Not on file   Years of education: Not on file   Highest education level: Not on file  Occupational History   Not on file  Tobacco Use   Smoking status: Former   Smokeless tobacco: Never  Vaping Use   Vaping Use: Former  Substance and Sexual Activity   Alcohol use: Yes    Alcohol/week: 2.0 standard drinks of alcohol    Types: 2 Glasses of wine per week    Comment: occassionally  Drug use: No   Sexual activity: Yes  Other Topics Concern   Not on file  Social History Narrative   Engaged lives with boyfriend for 3 years.Previously married for 7 years.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   Social Determinants of Health   Financial Resource Strain: Not on file  Food Insecurity: Not on file  Transportation Needs: Not on file  Physical Activity: Not on file  Stress: Not on file  Social Connections: Not on file  Intimate Partner Violence: Not on file    Current Outpatient Medications on File Prior to Visit  Medication Sig Dispense Refill   buPROPion (WELLBUTRIN XL) 300 MG 24 hr tablet TAKE 1 TABLET BY MOUTH EVERYDAY AT BEDTIME     Cholecalciferol ([employer] VIT D 5000 HIGH-POTENCY PO) Take 15,000 Units by mouth daily at 12 noon.     Cinnamon 500 MG capsule Take 750 mg by mouth daily.      clonazePAM (KLONOPIN) 0.5 MG tablet Take 1 tablet by mouth 2 (two) times daily.     Drospirenone (SLYND) 4 MG TABS Take 1 tablet (4 mg total) by mouth daily. 84 tablet 0   meloxicam (MOBIC) 15 MG tablet Take by mouth.     Multiple Vitamins-Minerals  (MULTI + OMEGA-3 ADULT GUMMIES) CHEW      omeprazole (PRILOSEC) 20 MG capsule Take 1 capsule (20 mg total) by mouth daily. 90 capsule 3   tiZANidine (ZANAFLEX) 4 MG tablet tizanidine 4 mg tablet  TAKE 1 TABLET BY MOUTH 3 TIMES DAILY.     No current facility-administered medications on file prior to visit.    Allergies  Allergen Reactions   Wound Dressing Adhesive Rash and Itching     Review of Systems Pertinent items noted in HPI and remainder of comprehensive ROS otherwise negative.   Objective:   Blood pressure (!) 129/92, pulse (!) 111, resp. rate 16, height 5\' 6"  (1.676 m), weight 200 lb 1.6 oz (90.8 kg). Body mass index is 32.3 kg/m. General appearance: alert and no distress Abdomen: soft, non-tender; bowel sounds normal; no masses,  no organomegaly Pelvic: external genitalia normal, rectovaginal septum normal.  Vagina without discharge, scant blood in vaginal vault.  Cervix normal appearing, no lesions and no motion tenderness.  Uterus mobile, nontender, normal shape and size.  Adnexae non-palpable, nontender bilaterally.  Extremities: extremities normal, atraumatic, no cyanosis or edema Neurologic: Grossly normal   Assessment:   1. DUB (dysfunctional uterine bleeding)   2. PCOS (polycystic ovarian syndrome)   3. Acquired hypothyroidism   4. Breast cancer screening by mammogram   5. Cervical cancer screening      Plan:  1. DUB (dysfunctional uterine bleeding) -Patient with history of dysfunctional uterine bleeding, most likely due to her history of PCOS.  Currently managing with Slynd however has also initiated bioidentical hormones recently.  Plans to wean off the [facility] however is worried that her bleeding may present again and she has plans to travel in the next month or so.  I discussed giving her 1 additional pack of Slynd such that if after the current back her bleeding continues persistently she can resume this line for an additional month. - Thyroid Panel With  TSH - US PELVIS TRANSVAGINAL NON-OB (TV ONLY); Future  2. PCOS (polycystic ovarian syndrome) -Patient has not had PCOS labs drawn in some time.  Will order today. - DHEA-sulfate - Prolactin - Testosterone, Free, Total, SHBG - Thyroid Panel With TSH - Hemoglobin A1c - Lipid panel - US PELVIS TRANSVAGINAL NON-OB (TV  ONLY); Future  3. Acquired hypothyroidism Patient has not had her thyroid checked in the past year.  Currently on no meds. - Thyroid Panel With TSH  4. Breast cancer screening by mammogram Patient overdue for breast cancer screening, has never had a mammogram.  Ordered today.  5. Cervical cancer screening Patient is overdue for cervical cancer screening.  In light of dysfunctional uterine bleeding will perform today. - Cytology - PAP   Strongly encourage patient to follow-up in the next 1 to 2 months for her routine wellness exam if she has not found a new PCP to establish with.   Hildred Laser, MD [facility]

## 2023-04-12 ENCOUNTER — Other Ambulatory Visit: Payer: Self-pay | Admitting: Obstetrics and Gynecology

## 2023-04-12 ENCOUNTER — Ambulatory Visit (INDEPENDENT_AMBULATORY_CARE_PROVIDER_SITE_OTHER): Payer: Medicaid Other | Admitting: Obstetrics and Gynecology

## 2023-04-12 ENCOUNTER — Other Ambulatory Visit (HOSPITAL_COMMUNITY)
Admission: RE | Admit: 2023-04-12 | Discharge: 2023-04-12 | Disposition: A | Discharge status: Home / Self Care | Payer: Medicaid Other | Source: Ambulatory Visit | Attending: Obstetrics and Gynecology | Admitting: Obstetrics and Gynecology

## 2023-04-12 ENCOUNTER — Ambulatory Visit (INDEPENDENT_AMBULATORY_CARE_PROVIDER_SITE_OTHER): Payer: Medicaid Other

## 2023-04-12 VITALS — BP 130/79 | HR 104 | Resp 16 | Ht 66.0 in | Wt 200.1 lb

## 2023-04-12 DIAGNOSIS — Z124 Encounter for screening for malignant neoplasm of cervix: Secondary | ICD-10-CM

## 2023-04-12 DIAGNOSIS — E282 Polycystic ovarian syndrome: Secondary | ICD-10-CM

## 2023-04-12 DIAGNOSIS — E039 Hypothyroidism, unspecified: Secondary | ICD-10-CM

## 2023-04-12 DIAGNOSIS — N938 Other specified abnormal uterine and vaginal bleeding: Secondary | ICD-10-CM

## 2023-04-12 DIAGNOSIS — Z1231 Encounter for screening mammogram for malignant neoplasm of breast: Secondary | ICD-10-CM

## 2023-04-14 ENCOUNTER — Encounter: Payer: Self-pay | Admitting: Obstetrics and Gynecology

## 2023-04-16 ENCOUNTER — Encounter: Payer: Self-pay | Admitting: Obstetrics and Gynecology

## 2023-04-16 DIAGNOSIS — E039 Hypothyroidism, unspecified: Secondary | ICD-10-CM

## 2023-04-16 DIAGNOSIS — R7303 Prediabetes: Secondary | ICD-10-CM

## 2023-04-16 DIAGNOSIS — E785 Hyperlipidemia, unspecified: Secondary | ICD-10-CM

## 2023-04-16 DIAGNOSIS — E282 Polycystic ovarian syndrome: Secondary | ICD-10-CM

## 2023-04-17 LAB — CYTOLOGY - PAP
Comment: NEGATIVE
Diagnosis: NEGATIVE
High risk HPV: NEGATIVE

## 2023-04-18 LAB — LIPID PANEL
Chol/HDL Ratio: 4 ratio (ref 0.0–4.4)
Cholesterol, Total: 209 mg/dL — ABNORMAL HIGH (ref 100–199)
HDL: 52 mg/dL (ref >39)
LDL Chol Calc (NIH): 130 mg/dL — ABNORMAL HIGH (ref 0–99)
Triglycerides: 150 mg/dL — ABNORMAL HIGH (ref 0–149)
VLDL Cholesterol Cal: 27 mg/dL (ref 5–40)

## 2023-04-18 LAB — THYROID PANEL WITH TSH
Free Thyroxine Index: 1.7 (ref 1.2–4.9)
T3 Uptake Ratio: 27 % (ref 24–39)
T4, Total: 6.4 ug/dL (ref 4.5–12.0)
TSH: 2.99 u[IU]/mL (ref 0.450–4.500)

## 2023-04-18 LAB — TESTOSTERONE, FREE, TOTAL, SHBG
Sex Hormone Binding: 23.7 nmol/L — ABNORMAL LOW (ref 24.6–122.0)
Testosterone, Free: 2.3 pg/mL (ref 0.0–4.2)
Testosterone: 28 ng/dL (ref 4–50)

## 2023-04-18 LAB — HEMOGLOBIN A1C
Est. average glucose Bld gHb Est-mCnc: 117 mg/dL
Hgb A1c MFr Bld: 5.7 % — ABNORMAL HIGH (ref 4.8–5.6)

## 2023-04-18 LAB — PROLACTIN: Prolactin: 10.3 ng/mL (ref 4.8–33.4)

## 2023-04-18 LAB — DHEA-SULFATE: DHEA-SO4: 369 ug/dL — ABNORMAL HIGH (ref 57.3–279.2)

## 2023-05-08 ENCOUNTER — Ambulatory Visit
Admission: RE | Admit: 2023-05-08 | Discharge: 2023-05-08 | Disposition: A | Discharge status: Home / Self Care | Payer: Medicaid Other | Source: Ambulatory Visit | Attending: Obstetrics and Gynecology | Admitting: Obstetrics and Gynecology

## 2023-05-08 DIAGNOSIS — Z1231 Encounter for screening mammogram for malignant neoplasm of breast: Secondary | ICD-10-CM | POA: Insufficient documentation

## 2023-07-03 ENCOUNTER — Telehealth (INDEPENDENT_AMBULATORY_CARE_PROVIDER_SITE_OTHER): Payer: Medicaid Other | Admitting: Obstetrics and Gynecology

## 2023-07-03 ENCOUNTER — Encounter: Payer: Self-pay | Admitting: Obstetrics and Gynecology

## 2023-07-03 VITALS — Ht 64.0 in | Wt 200.0 lb

## 2023-07-03 DIAGNOSIS — R7303 Prediabetes: Secondary | ICD-10-CM

## 2023-07-03 DIAGNOSIS — E282 Polycystic ovarian syndrome: Secondary | ICD-10-CM | POA: Diagnosis not present

## 2023-07-03 DIAGNOSIS — E039 Hypothyroidism, unspecified: Secondary | ICD-10-CM | POA: Diagnosis not present

## 2023-07-03 DIAGNOSIS — E785 Hyperlipidemia, unspecified: Secondary | ICD-10-CM

## 2023-07-03 DIAGNOSIS — N938 Other specified abnormal uterine and vaginal bleeding: Secondary | ICD-10-CM

## 2023-07-03 MED ORDER — NORETHIN ACE-ETH ESTRAD-FE 1-20 MG-MCG PO TABS
1.0000 | ORAL_TABLET | Freq: Every day | ORAL | 3 refills | Status: DC
Start: 2023-07-03 — End: 2023-09-08

## 2023-07-03 NOTE — Progress Notes (Signed)
   Virtual Visit via Video Note (Gynecology Office)  I connected with Rachel Rojas on 07/03/23 at  3:55 PM EDT by a video enabled telemedicine application and verified that I am speaking with the correct person using two identifiers.  Location: Patient: Home Provider: Office   I discussed the limitations of evaluation and management by telemedicine and the availability of in person appointments. The patient expressed understanding and agreed to proceed.    Subjective:    Patient ID: Rachel Rojas, female    DOB: 1981-05-18, 42 y.o.   MRN: [id number]  HPI  Patient is a 42 y.o. G55P1011 female who presents for follow up of abnormal uterine bleeding due to PCOS. She was referred to an Endocrinologist per her request, and notes that she was not pleased with her visit.  States that they restarted her on her metformin and increased her thyroid medication, and referred her back to GYN to start combined birth control.  Patient was previously on Slynd to help manage her cycle as she was going on vacation, and had recently initiated use of bioidentical hormones, however now notes that she has discontinued all of this.  Thinks that she may have had a cycle this past Friday.  Cycle lasted for 2 days and was very light.   The following portions of the patient's history were reviewed and updated as appropriate: allergies, current medications, past family history, past medical history, past social history, past surgical history, and problem list.  Review of Systems Pertinent items are noted in HPI.   Objective:   Height 5\' 4"  (1.626 m), weight 200 lb (90.7 kg) self-reported.  Body mass index is 34.33 kg/m. General appearance: alert, cooperative, and no distress Remainder of exam deferred   Assessment:   1. PCOS (polycystic ovarian syndrome)   2. Acquired hypothyroidism   3. Dyslipidemia   4. Prediabetes   5. DUB (dysfunctional uterine bleeding)      Plan:   Patient notes that she is now  willing to consider use of hormonal contraception for management of her PCOS and dysfunctional uterine bleeding.  Will initiate on 20 mcg tablets, can increase if needed. To f/u in 3 months if symptoms not improved.  Is to continue metformin and thyroid medication.  Patient also desires new referral to a different endocrinologist to establish care and for second opinion.  New referral placed.   Follow Up Instructions:    I discussed the assessment and treatment plan with the patient. The patient was provided an opportunity to ask questions and all were answered. The patient agreed with the plan and demonstrated an understanding of the instructions.   The patient was advised to call back or seek an in-person evaluation if the symptoms worsen or if the condition fails to improve as anticipated. Hildred Laser, MD [facility] [place]    I provided 12 minutes of non-face-to-face time during this encounter.    Hildred Laser, MD [facility]

## 2023-09-03 ENCOUNTER — Encounter: Payer: Self-pay | Admitting: Obstetrics and Gynecology

## 2023-09-08 MED ORDER — NORETHINDRONE ACET-ETHINYL EST 1.5-30 MG-MCG PO TABS: 1.0000 | ORAL_TABLET | Freq: Every day | ORAL | 3 refills | Status: DC

## 2024-06-01 ENCOUNTER — Other Ambulatory Visit: Payer: Self-pay | Admitting: Certified Nurse Midwife

## 2024-06-01 DIAGNOSIS — Z1231 Encounter for screening mammogram for malignant neoplasm of breast: Secondary | ICD-10-CM

## 2024-11-20 NOTE — H&P (Signed)
 GYNECOLOGY ULTRASOUND FOLLOW-UP VISIT   Chief Complaint: Rachel Rojas is a 43 y.o. (Patient's last menstrual period was 11/10/2024 (approximate).) seen for  Chief Complaint Patient presents with  Pre Op Consulting     Sign consents     History of Present Illness: History of Present Illness Rachel Rojas is a 43 year old female with PCOS who presents with abnormal uterine bleeding and fibroids.   Her menstrual cycles are painful and heavy. She is on metformin  and combined oral contraceptive pills for symptom management. An ultrasound shows multiple fibroids classified as type four, five, and six, with a total volume of 262 cm. Occasional ovarian discomfort is described as a 'tug tug' sensation. She is unsure if fibroids were present in a previous ultrasound conducted about a year ago. She has lost weight using a GLP-1 agonist and has a BMI of 27. Concerns include the implications of fibroids, her sudden appearance, and potential surgical intervention. She has a history of abdominal surgery and is concerned about scar tissue.     Results RADIOLOGY Pelvic ultrasound: Multiple fibroids, types IV, V, and VI, with a volume of 262 cm. No signs of malignancy. 4mm stripe   Last pap: 03/2023 EMBx: collected today       Gyn Hx: Patient's last menstrual period was 11/10/2024 (approximate)..       Past Medical History:  has a past medical history of Anxiety, Chicken pox, Depression (09/16/2021), GERD (gastroesophageal reflux disease), History of abnormal cervical Pap smear, Hormone deficiency (12/25/2020), Hyperlipidemia (01/14/2024), Hypertension, essential (01/14/2024), Infertility management, Migraines, PCOS (polycystic ovarian syndrome), and Thyroid  disease.  Past Surgical History:  has a past surgical history that includes BREAST IMPLANTS (Bilateral); TUMMY TUCK; arthrodesis anterior cervicle spine (N/A, 12/02/2020); arthrodesis anterior cervicle spine (N/A, 12/02/2020);  insertion interbody biomechanical device w/ instrumentation (N/A, 12/02/2020); instrumentation anterior spine 4 to 7 segments (N/A, 12/02/2020); insertion morselized bone allograft for spine surgery (N/A, 12/02/2020); Back surgery (12/02/2020); Joint replacement (11/30/2020); and Spine surgery (11/30/2020). Family History: family history includes Alcohol abuse in her brother, brother, brother, maternal grandfather, and maternal uncle; Anxiety in her mother and sister; Asthma in her sister; Cancer in her maternal grandmother and maternal uncle; Depression in her mother; Diabetes in her maternal grandmother; Diabetes type II in her maternal grandmother, maternal uncle, and maternal uncle; High blood pressure (Hypertension) in her mother; Hyperlipidemia (Elevated cholesterol) in her mother; Lung cancer in her maternal uncle; No Known Problems in her father. Social History:  reports that she quit smoking about 19 years ago. Her smoking use included cigarettes. She started smoking about 27 years ago. She has a 4 pack-year smoking history. She has never used smokeless tobacco. She reports that she does not currently use alcohol after a past usage of about 3.0 standard drinks of alcohol per week. She reports that she does not use drugs. OB/GYN History:  OB History       Gravida 2   Para 1   Term 1   Preterm     AB 1   Living 1       SAB     IAB 1   Ectopic     Molar     Multiple     Live Births 1          Allergies: is allergic to adhesive and wound dressings. Medications: Current Medications Current Outpatient Medications:    AUROVELA 1.5/30, 21, 1.5-30 mg-mcg tablet, Take 1 tablet by mouth once daily, Disp: 90 tablet, Rfl:  4   BIOTIN ORAL, Take by mouth 10000 mcg daily, Disp: , Rfl:    cholecalciferol (VITAMIN D3) 5,000 unit capsule, Take by mouth every morning 15,000 units daily   (Patient taking differently: Take 10,000 Units by mouth once daily), Disp: , Rfl:     Compound Medication, Tirzepatide compound 12.5, Disp: , Rfl:    ferrous sulfate (IRON ORAL), Take by mouth One daily, Disp: , Rfl:    FIBER, HERBAL, ORAL, Take by mouth Twice daily, Disp: , Rfl:    hyaluronate sodium (HYALURONIC ACID, SODIUM, ORAL), Take by mouth One daily, Disp: , Rfl:    Lactobacillus acidophilus (PROBIOTIC ORAL), Take by mouth One daily, Disp: , Rfl:    lisinopriL (ZESTRIL) 20 MG tablet, Take 1 tablet (20 mg total) by mouth once daily, Disp: 30 tablet, Rfl: 11   melatonin 1 mg tablet, Take 5 mg by mouth at bedtime, Disp: , Rfl:    metFORMIN  (GLUCOPHAGE -XR) 500 MG XR tablet, 500 mg 2 (two) times daily, Disp: , Rfl:    multivit-minerals/folic acid (MULTIVITAMIN GUMMIES ORAL), Take 1 capsule by mouth 3 (three) times daily With omega 3, Disp: , Rfl:    omeprazole  (PRILOSEC) 20 MG DR capsule, Take 20 mg by mouth once daily As needed, Disp: , Rfl:    pyridoxine, vitamin B6, (B-6) 250 MG tablet, Take 250 mg by mouth once daily, Disp: , Rfl:    thyroid  (ARMOUR) 90 mg tablet, Take 90 mg by mouth every morning before breakfast (0630), Disp: , Rfl:    gabapentin (NEURONTIN) 300 MG capsule, Take 1 capsule (300 mg total) by mouth 3 (three) times daily for 180 days (Patient not taking: Reported on 01/14/2024), Disp: 90 capsule, Rfl: 5    Impression:   The primary encounter diagnosis was Abnormal uterine bleeding (AUB). Diagnoses of Enlarged uterus, Intramural and subserous leiomyoma of uterus, and Primary dysmenorrhea were also pertinent to this visit.   Plan: The patient and I discussed the technical aspects of the procedure including the potential for risks and complications.  These include but are not limited to the risk of infection requiring post-operative antibiotics or further procedures.  We talked about the risk of injury to adjacent organs including bladder, bowel, ureter, blood vessels or nerves.  We talked about the need to convert to an open incision.  We talked about the  possible need for blood transfusion.  We talked about postop complications such as thromboembolic or cardiopulmonary complications.  All of her questions were answered.  Her preoperative exam was completed and the appropriate consents were signed. She is scheduled to undergo this procedure in the near future. Assessment & Plan Uterine fibroids with abnormal uterine bleeding Ultrasound showed multiple fibroids (type 4, 5, and 6) with a volume of 262 cm. No malignancy signs on ultrasound. - Perform in vitro biopsy to rule out malignancy. - Recommend hysterectomy with minimally invasive robotic surgery. - Discuss potential removal of left ovary, retain right ovary for hormonal balance. - Discuss surgical risks and benefits, including recovery time and potential need for larger incision.   Polycystic ovary syndrome (PCOS) PCOS with painful and heavy menstrual cycles. - Continue management with metformin  and combined oral contraceptive pills.   BMI is 27.     Plan for RATLH, BS and left oophorectomy Entry above neoumbilicus, and use abdominoplasty scar if needed for morcellation  - f/u embx   Diagnoses and all orders for this visit:   Abnormal uterine bleeding (AUB)   Enlarged uterus  Intramural and subserous leiomyoma of uterus   Primary dysmenorrhea

## 2024-11-26 ENCOUNTER — Inpatient Hospital Stay
Admission: RE | Admit: 2024-11-26 | Discharge: 2024-11-26 | Attending: Obstetrics and Gynecology | Admitting: Obstetrics and Gynecology

## 2024-11-26 ENCOUNTER — Other Ambulatory Visit: Payer: Self-pay

## 2024-11-26 VITALS — Ht 64.0 in | Wt 165.0 lb

## 2024-11-26 DIAGNOSIS — Z01812 Encounter for preprocedural laboratory examination: Secondary | ICD-10-CM

## 2024-11-26 DIAGNOSIS — Z01810 Encounter for preprocedural cardiovascular examination: Secondary | ICD-10-CM

## 2024-11-26 DIAGNOSIS — I10 Essential (primary) hypertension: Secondary | ICD-10-CM

## 2024-11-26 DIAGNOSIS — Z01818 Encounter for other preprocedural examination: Secondary | ICD-10-CM

## 2024-11-26 DIAGNOSIS — N938 Other specified abnormal uterine and vaginal bleeding: Secondary | ICD-10-CM

## 2024-11-26 NOTE — Patient Instructions (Addendum)
 Your procedure is scheduled on:12-03-24 Thursday Report to the Registration Desk on the 1st floor of the Medical Mall.Then proceed to the 2nd floor Surgery Desk To find out your arrival time, please call [phone] between 1PM - 3PM on:12-02-24 Wednesday If your arrival time is 6:00 am, do not arrive before that time as the Medical Mall entrance doors do not open until 6:00 am.  REMEMBER: Instructions that are not followed completely may result in serious medical risk, up to and including death; or upon the discretion of your surgeon and anesthesiologist your surgery may need to be rescheduled.  Do not eat food after midnight the night before surgery.  No gum chewing or hard candies.  You may however, drink CLEAR liquids up to 2 hours before you are scheduled to arrive for your surgery. Do not drink anything within 2 hours of your scheduled arrival time.  Clear liquids include: - water  - apple juice without pulp - gatorade (not RED colors) - black coffee or tea (Do NOT add milk or creamers to the coffee or tea) Do NOT drink anything that is not on this list.  One week prior to surgery:Stop NOW (11-26-24) Stop Anti-inflammatories (NSAIDS) such as Advil, Aleve, Ibuprofen, Motrin, Naproxen, Naprosyn and Aspirin based products such as Excedrin, Goody's Powder, BC Powder. Stop ANY OVER THE COUNTER supplements until after surgery (Biotin, Vitamin D 3, Melatonin, Multivitamin, Hyaluronic Acid, Collagen)  You may however, continue to take Tylenol if needed for pain up until the day of surgery.  Stop Tirzepatide 7 days prior to surgery-Do NOT take again until AFTER surgery  Stop metFORMIN  (GLUCOPHAGE -XR) 2 days prior to surgery-Last dose will be on 11-30-24 Monday  Continue taking all of your other prescription medications up until the day of surgery.  ON THE DAY OF SURGERY ONLY TAKE THESE MEDICATIONS WITH SIPS OF WATER: -thyroid  (ARMOUR)   No Alcohol for 24 hours before or after  surgery.  No Smoking including e-cigarettes for 24 hours before surgery.  No chewable tobacco products for at least 6 hours before surgery.  No nicotine patches on the day of surgery.  Do not use any recreational drugs for at least a week (preferably 2 weeks) before your surgery.  Please be advised that the combination of cocaine and anesthesia may have negative outcomes, up to and including death. If you test positive for cocaine, your surgery will be cancelled.  On the morning of surgery brush your teeth with toothpaste and water, you may rinse your mouth with mouthwash if you wish. Do not swallow any toothpaste or mouthwash.  Use CHG Soap as directed on instruction sheet.  Do not wear jewelry, make-up, hairpins, clips or nail polish.  For welded (permanent) jewelry: bracelets, anklets, waist bands, etc.  Please have this removed prior to surgery.  If it is not removed, there is a chance that hospital personnel will need to cut it off on the day of surgery.  Do not wear lotions, powders, or perfumes.   Do not shave body hair from the neck down 48 hours before surgery.  Contact lenses, hearing aids and dentures may not be worn into surgery.  Do not bring valuables to the hospital. [facility] is not responsible for any missing/lost belongings or valuables.   Notify your doctor if there is any change in your medical condition (cold, fever, infection).  Wear comfortable clothing (specific to your surgery type) to the hospital.  After surgery, you can help prevent lung complications by doing  breathing exercises.  Take deep breaths and cough every 1-2 hours. Your doctor may order a device called an Incentive Spirometer to help you take deep breaths. When coughing or sneezing, hold a pillow firmly against your incision with both hands. This is called splinting. Doing this helps protect your incision. It also decreases belly discomfort.  If you are being admitted to the hospital  overnight, leave your suitcase in the car. After surgery it may be brought to your room.  In case of increased patient census, it may be necessary for you, the patient, to continue your postoperative care in the Same Day Surgery department.  If you are being discharged the day of surgery, you will not be allowed to drive home. You will need a responsible individual to drive you home and stay with you for 24 hours after surgery.   If you are taking public transportation, you will need to have a responsible individual with you.  Please call the Pre-admissions Testing Dept. at [phone] if you have any questions about these instructions.  Surgery Visitation Policy:  Patients having surgery or a procedure may have two visitors.  Children under the age of 57 must have an adult with them who is not the patient.                                                                                                             Preparing for Surgery with CHLORHEXIDINE GLUCONATE (CHG) Soap  Chlorhexidine Gluconate (CHG) Soap  o An antiseptic cleaner that kills germs and bonds with the skin to continue killing germs even after washing  o Used for showering the night before surgery and morning of surgery  Before surgery, you can play an important role by reducing the number of germs on your skin.  CHG (Chlorhexidine gluconate) soap is an antiseptic cleanser which kills germs and bonds with the skin to continue killing germs even after washing.  Please do not use if you have an allergy to CHG or antibacterial soaps. If your skin becomes reddened/irritated stop using the CHG.  1. Shower the NIGHT BEFORE SURGERY with CHG soap.  2. If you choose to wash your hair, wash your hair first as usual with your normal shampoo.  3. After shampooing, rinse your hair and body thoroughly to remove the shampoo.  4. Use CHG as you would any other liquid soap. You can apply CHG directly to the skin and wash  gently with a clean washcloth.  5. Apply the CHG soap to your body only from the neck down. Do not use on open wounds or open sores. Avoid contact with your eyes, ears, mouth, and genitals (private parts). Wash face and genitals (private parts) with your normal soap.  6. Wash thoroughly, paying special attention to the area where your surgery will be performed.  7. Thoroughly rinse your body with warm water.  8. Do not shower/wash with your normal soap after using and rinsing off the CHG soap.  9. Do not use lotions, oils, etc., after showering  with CHG.  10. Pat yourself dry with a clean towel.  11. Wear clean pajamas to bed the night before surgery.  12. Place clean sheets on your bed the night of your shower and do not sleep with pets.  13. Do not apply any deodorants/lotions/powders.  14. Please wear clean clothes to the hospital.  15. Remember to brush your teeth with your regular toothpaste.   Merchandiser, Retail to address health-related social needs:  [email or web address]

## 2024-11-30 ENCOUNTER — Inpatient Hospital Stay
Admission: RE | Admit: 2024-11-30 | Discharge: 2024-11-30 | Attending: Obstetrics and Gynecology | Admitting: Obstetrics and Gynecology

## 2024-11-30 DIAGNOSIS — I10 Essential (primary) hypertension: Secondary | ICD-10-CM

## 2024-11-30 DIAGNOSIS — N938 Other specified abnormal uterine and vaginal bleeding: Secondary | ICD-10-CM

## 2024-11-30 DIAGNOSIS — Z01810 Encounter for preprocedural cardiovascular examination: Secondary | ICD-10-CM

## 2024-11-30 DIAGNOSIS — Z01818 Encounter for other preprocedural examination: Secondary | ICD-10-CM | POA: Diagnosis present

## 2024-11-30 LAB — CBC
HCT: 41.6 % (ref 36.0–46.0)
Hemoglobin: 14.5 g/dL (ref 12.0–15.0)
MCH: 30.5 pg (ref 26.0–34.0)
MCHC: 34.9 g/dL (ref 30.0–36.0)
MCV: 87.6 fL (ref 80.0–100.0)
Platelets: 266 K/uL (ref 150–400)
RBC: 4.75 MIL/uL (ref 3.87–5.11)
RDW: 11.9 % (ref 11.5–15.5)
WBC: 8.1 K/uL (ref 4.0–10.5)
nRBC: 0 % (ref 0.0–0.2)

## 2024-11-30 LAB — BASIC METABOLIC PANEL WITH GFR
Anion gap: 12 (ref 5–15)
BUN: 10 mg/dL (ref 6–20)
CO2: 22 mmol/L (ref 22–32)
Calcium: 9.2 mg/dL (ref 8.9–10.3)
Chloride: 101 mmol/L (ref 98–111)
Creatinine, Ser: 0.82 mg/dL (ref 0.44–1.00)
GFR, Estimated: >60 mL/min (ref >60)
Glucose, Bld: 93 mg/dL (ref 70–99)
Potassium: 3.4 mmol/L — ABNORMAL LOW (ref 3.5–5.1)
Sodium: 135 mmol/L (ref 135–145)

## 2024-11-30 LAB — TYPE AND SCREEN
ABO/RH(D): B POS
Antibody Screen: NEGATIVE

## 2024-12-03 ENCOUNTER — Encounter: Payer: Self-pay | Admitting: Obstetrics and Gynecology

## 2024-12-03 ENCOUNTER — Ambulatory Visit: Admitting: Certified Registered"

## 2024-12-03 ENCOUNTER — Encounter: Admission: RE | Disposition: A | Payer: Self-pay | Source: Home / Self Care | Attending: Obstetrics and Gynecology

## 2024-12-03 ENCOUNTER — Other Ambulatory Visit: Payer: Self-pay

## 2024-12-03 ENCOUNTER — Ambulatory Visit
Admission: RE | Admit: 2024-12-03 | Discharge: 2024-12-03 | Disposition: A | Discharge status: Home / Self Care | Attending: Obstetrics and Gynecology | Admitting: Obstetrics and Gynecology

## 2024-12-03 DIAGNOSIS — Z7984 Long term (current) use of oral hypoglycemic drugs: Secondary | ICD-10-CM | POA: Diagnosis not present

## 2024-12-03 DIAGNOSIS — N946 Dysmenorrhea, unspecified: Secondary | ICD-10-CM | POA: Diagnosis present

## 2024-12-03 DIAGNOSIS — N838 Other noninflammatory disorders of ovary, fallopian tube and broad ligament: Secondary | ICD-10-CM | POA: Insufficient documentation

## 2024-12-03 DIAGNOSIS — Z87891 Personal history of nicotine dependence: Secondary | ICD-10-CM | POA: Diagnosis not present

## 2024-12-03 DIAGNOSIS — I10 Essential (primary) hypertension: Secondary | ICD-10-CM | POA: Diagnosis not present

## 2024-12-03 DIAGNOSIS — D252 Subserosal leiomyoma of uterus: Secondary | ICD-10-CM | POA: Diagnosis not present

## 2024-12-03 DIAGNOSIS — D259 Leiomyoma of uterus, unspecified: Secondary | ICD-10-CM | POA: Diagnosis present

## 2024-12-03 DIAGNOSIS — E039 Hypothyroidism, unspecified: Secondary | ICD-10-CM | POA: Diagnosis not present

## 2024-12-03 DIAGNOSIS — N939 Abnormal uterine and vaginal bleeding, unspecified: Secondary | ICD-10-CM | POA: Insufficient documentation

## 2024-12-03 DIAGNOSIS — N803C2 Endometriosis of the left uterosacral ligament, unspecified depth: Secondary | ICD-10-CM | POA: Insufficient documentation

## 2024-12-03 DIAGNOSIS — E282 Polycystic ovarian syndrome: Secondary | ICD-10-CM | POA: Diagnosis not present

## 2024-12-03 DIAGNOSIS — D251 Intramural leiomyoma of uterus: Secondary | ICD-10-CM | POA: Insufficient documentation

## 2024-12-03 DIAGNOSIS — N944 Primary dysmenorrhea: Secondary | ICD-10-CM | POA: Diagnosis not present

## 2024-12-03 DIAGNOSIS — N8003 Adenomyosis of the uterus: Secondary | ICD-10-CM | POA: Diagnosis not present

## 2024-12-03 DIAGNOSIS — K219 Gastro-esophageal reflux disease without esophagitis: Secondary | ICD-10-CM | POA: Diagnosis not present

## 2024-12-03 DIAGNOSIS — G8929 Other chronic pain: Secondary | ICD-10-CM | POA: Insufficient documentation

## 2024-12-03 DIAGNOSIS — Z01818 Encounter for other preprocedural examination: Secondary | ICD-10-CM

## 2024-12-03 DIAGNOSIS — N938 Other specified abnormal uterine and vaginal bleeding: Secondary | ICD-10-CM

## 2024-12-03 DIAGNOSIS — N888 Other specified noninflammatory disorders of cervix uteri: Secondary | ICD-10-CM | POA: Diagnosis not present

## 2024-12-03 HISTORY — PX: ROBOTIC ASSISTED LAPAROSCOPIC LYSIS OF ADHESION: SHX6080

## 2024-12-03 HISTORY — PX: HYSTERECTOMY, TOTAL, LAPAROSCOPIC, ROBOT-ASSISTED WITH SALPINGECTOMY: SHX7587

## 2024-12-03 HISTORY — PX: CYSTOSCOPY: SHX5120

## 2024-12-03 HISTORY — PX: EXCISION, ENDOMETRIOSIS, ROBOTIC ASSISTED, LAPAROSCOPIC: SHX7564

## 2024-12-03 HISTORY — PX: OOPHORECTOMY, ROBOT ASSISTED, LAPAROSCOPIC: SHX7388

## 2024-12-03 LAB — POCT PREGNANCY, URINE: Preg Test, Ur: NEGATIVE

## 2024-12-03 LAB — ABO/RH: ABO/RH(D): B POS

## 2024-12-03 SURGERY — HYSTERECTOMY, TOTAL, LAPAROSCOPIC, ROBOT-ASSISTED WITH SALPINGECTOMY
Anesthesia: General | Site: Uterus

## 2024-12-03 MED ORDER — CHLORHEXIDINE GLUCONATE 0.12 % MT SOLN
15.0000 mL | Freq: Once | OROMUCOSAL | Status: AC
  Administered 2024-12-03: 10:00:00 15 mL via OROMUCOSAL

## 2024-12-03 MED ORDER — MIDAZOLAM HCL 2 MG/2ML IJ SOLN
INTRAMUSCULAR | Status: AC
  Filled 2024-12-03: qty 2

## 2024-12-03 MED ORDER — ONDANSETRON 4 MG PO TBDP: 4.0000 mg | ORAL_TABLET | Freq: Three times a day (TID) | ORAL | 0 refills | Status: AC | PRN

## 2024-12-03 MED ORDER — PROPOFOL 10 MG/ML IV BOLUS
INTRAVENOUS | Status: DC | PRN
  Administered 2024-12-03: 11:00:00 200 ug/kg/min via INTRAVENOUS
  Administered 2024-12-03: 11:00:00 180 mg via INTRAVENOUS

## 2024-12-03 MED ORDER — FENTANYL CITRATE (PF) 100 MCG/2ML IJ SOLN
INTRAMUSCULAR | Status: AC
  Filled 2024-12-03: qty 2

## 2024-12-03 MED ORDER — FENTANYL CITRATE (PF) 100 MCG/2ML IJ SOLN
INTRAMUSCULAR | Status: DC | PRN
  Administered 2024-12-03 (×4): 50 ug via INTRAVENOUS
  Administered 2024-12-03: 11:00:00 75 ug via INTRAVENOUS
  Administered 2024-12-03: 11:00:00 25 ug via INTRAVENOUS

## 2024-12-03 MED ORDER — ONDANSETRON HCL 4 MG/2ML IJ SOLN
INTRAMUSCULAR | Status: AC
  Filled 2024-12-03: qty 4

## 2024-12-03 MED ORDER — OXYCODONE HCL 5 MG PO TABS
ORAL_TABLET | ORAL | Status: AC
  Filled 2024-12-03: qty 1

## 2024-12-03 MED ORDER — ROCURONIUM BROMIDE 10 MG/ML (PF) SYRINGE
PREFILLED_SYRINGE | INTRAVENOUS | Status: AC
  Filled 2024-12-03: qty 20

## 2024-12-03 MED ORDER — CHLORHEXIDINE GLUCONATE 0.12 % MT SOLN
OROMUCOSAL | Status: AC
  Filled 2024-12-03: qty 15

## 2024-12-03 MED ORDER — POVIDONE-IODINE 10 % EX SWAB: 2.0000 | Freq: Once | CUTANEOUS | Status: DC

## 2024-12-03 MED ORDER — PROPOFOL 1000 MG/100ML IV EMUL
INTRAVENOUS | Status: AC
  Filled 2024-12-03: qty 100

## 2024-12-03 MED ORDER — ACETAMINOPHEN EXTRA STRENGTH 500 MG PO TABS: 1000.0000 mg | ORAL_TABLET | Freq: Four times a day (QID) | ORAL | 0 refills | Status: AC

## 2024-12-03 MED ORDER — 0.9 % SODIUM CHLORIDE (POUR BTL) OPTIME
TOPICAL | Status: DC | PRN
  Administered 2024-12-03: 12:00:00 500 mL

## 2024-12-03 MED ORDER — CELECOXIB 200 MG PO CAPS
ORAL_CAPSULE | ORAL | Status: AC
  Filled 2024-12-03: qty 1

## 2024-12-03 MED ORDER — CELECOXIB 200 MG PO CAPS: 200.0000 mg | ORAL_CAPSULE | Freq: Two times a day (BID) | ORAL | 0 refills | Status: AC

## 2024-12-03 MED ORDER — KETAMINE HCL 50 MG/5ML IJ SOSY
PREFILLED_SYRINGE | INTRAMUSCULAR | Status: AC
  Filled 2024-12-03: qty 5

## 2024-12-03 MED ORDER — BUPIVACAINE HCL (PF) 0.5 % IJ SOLN
INTRAMUSCULAR | Status: DC | PRN
  Administered 2024-12-03: 12:00:00 9 mL

## 2024-12-03 MED ORDER — BUPIVACAINE HCL (PF) 0.5 % IJ SOLN
INTRAMUSCULAR | Status: AC
  Filled 2024-12-03: qty 30

## 2024-12-03 MED ORDER — DEXAMETHASONE SOD PHOSPHATE PF 10 MG/ML IJ SOLN
INTRAMUSCULAR | Status: DC | PRN
  Administered 2024-12-03: 11:00:00 10 mg via INTRAVENOUS

## 2024-12-03 MED ORDER — METRONIDAZOLE 500 MG PO TABS
500.0000 mg | ORAL_TABLET | ORAL | Status: DC
  Filled 2024-12-03: qty 1

## 2024-12-03 MED ORDER — DEXMEDETOMIDINE HCL IN NACL 80 MCG/20ML IV SOLN
INTRAVENOUS | Status: DC | PRN
  Administered 2024-12-03: 13:00:00 4 ug via INTRAVENOUS
  Administered 2024-12-03 (×2): 8 ug via INTRAVENOUS
  Administered 2024-12-03: 11:00:00 4 ug via INTRAVENOUS
  Administered 2024-12-03 (×2): 8 ug via INTRAVENOUS

## 2024-12-03 MED ORDER — GABAPENTIN 300 MG PO CAPS: 300.0000 mg | ORAL_CAPSULE | Freq: Every day | ORAL | 0 refills | Status: AC

## 2024-12-03 MED ORDER — CELECOXIB 200 MG PO CAPS
400.0000 mg | ORAL_CAPSULE | ORAL | Status: AC
  Administered 2024-12-03: 10:00:00 400 mg via ORAL

## 2024-12-03 MED ORDER — DEXMEDETOMIDINE HCL IN NACL 80 MCG/20ML IV SOLN
INTRAVENOUS | Status: AC
  Filled 2024-12-03: qty 20

## 2024-12-03 MED ORDER — PHENYLEPHRINE 80 MCG/ML (10ML) SYRINGE FOR IV PUSH (FOR BLOOD PRESSURE SUPPORT)
PREFILLED_SYRINGE | INTRAVENOUS | Status: AC
  Filled 2024-12-03: qty 10

## 2024-12-03 MED ORDER — LACTATED RINGERS IV SOLN: INTRAVENOUS | Status: DC

## 2024-12-03 MED ORDER — ORAL CARE MOUTH RINSE: 15.0000 mL | Freq: Once | OROMUCOSAL | Status: AC

## 2024-12-03 MED ORDER — CEFAZOLIN SODIUM-DEXTROSE 2-4 GM/100ML-% IV SOLN
INTRAVENOUS | Status: AC
  Filled 2024-12-03: qty 100

## 2024-12-03 MED ORDER — OXYCODONE HCL 5 MG PO TABS
5.0000 mg | ORAL_TABLET | Freq: Once | ORAL | Status: AC
  Administered 2024-12-03: 14:00:00 5 mg via ORAL

## 2024-12-03 MED ORDER — DROPERIDOL 2.5 MG/ML IJ SOLN: 0.6250 mg | Freq: Once | INTRAMUSCULAR | Status: DC | PRN

## 2024-12-03 MED ORDER — FENTANYL CITRATE (PF) 100 MCG/2ML IJ SOLN
25.0000 ug | INTRAMUSCULAR | Status: DC | PRN
  Administered 2024-12-03 (×2): 25 ug via INTRAVENOUS
  Administered 2024-12-03 (×2): 50 ug via INTRAVENOUS

## 2024-12-03 MED ORDER — MIDAZOLAM HCL (PF) 2 MG/2ML IJ SOLN
INTRAMUSCULAR | Status: DC | PRN
  Administered 2024-12-03: 11:00:00 2 mg via INTRAVENOUS

## 2024-12-03 MED ORDER — LIDOCAINE HCL (CARDIAC) PF 100 MG/5ML IV SOSY
PREFILLED_SYRINGE | INTRAVENOUS | Status: DC | PRN
  Administered 2024-12-03: 11:00:00 80 mg via INTRAVENOUS

## 2024-12-03 MED ORDER — FUROSEMIDE 10 MG/ML IJ SOLN
INTRAMUSCULAR | Status: AC
  Filled 2024-12-03: qty 4

## 2024-12-03 MED ORDER — ONDANSETRON HCL 4 MG/2ML IJ SOLN
INTRAMUSCULAR | Status: DC | PRN
  Administered 2024-12-03: 13:00:00 4 mg via INTRAVENOUS

## 2024-12-03 MED ORDER — METRONIDAZOLE 500 MG/100ML IV SOLN
500.0000 mg | Freq: Once | INTRAVENOUS | Status: AC
  Administered 2024-12-03: 11:00:00 500 mg via INTRAVENOUS
  Filled 2024-12-03: qty 100

## 2024-12-03 MED ORDER — ROCURONIUM BROMIDE 100 MG/10ML IV SOLN
INTRAVENOUS | Status: DC | PRN
  Administered 2024-12-03: 12:00:00 20 mg via INTRAVENOUS
  Administered 2024-12-03: 11:00:00 60 mg via INTRAVENOUS
  Administered 2024-12-03: 12:00:00 20 mg via INTRAVENOUS
  Administered 2024-12-03: 13:00:00 10 mg via INTRAVENOUS

## 2024-12-03 MED ORDER — OXYCODONE HCL 5 MG PO TABS: 5.0000 mg | ORAL_TABLET | ORAL | 0 refills | Status: AC | PRN

## 2024-12-03 MED ORDER — CEFAZOLIN SODIUM-DEXTROSE 2-4 GM/100ML-% IV SOLN
2.0000 g | INTRAVENOUS | Status: AC
  Administered 2024-12-03: 11:00:00 2 g via INTRAVENOUS

## 2024-12-03 MED ORDER — ACETAMINOPHEN 500 MG PO TABS
ORAL_TABLET | ORAL | Status: AC
  Filled 2024-12-03: qty 2

## 2024-12-03 MED ORDER — SUGAMMADEX SODIUM 200 MG/2ML IV SOLN
INTRAVENOUS | Status: DC | PRN
  Administered 2024-12-03: 13:00:00 200 mg via INTRAVENOUS

## 2024-12-03 MED ORDER — ACETAMINOPHEN 500 MG PO TABS
1000.0000 mg | ORAL_TABLET | ORAL | Status: AC
  Administered 2024-12-03: 10:00:00 1000 mg via ORAL

## 2024-12-03 MED ORDER — LIDOCAINE HCL (PF) 2 % IJ SOLN
INTRAMUSCULAR | Status: AC
  Filled 2024-12-03: qty 10

## 2024-12-03 MED FILL — Propofol IV Emul 200 MG/20ML (10 MG/ML): INTRAVENOUS | Qty: 20 | Status: AC

## 2024-12-03 SURGICAL SUPPLY — 60 items
BAG URINE DRAIN 2000ML AR STRL (UROLOGICAL SUPPLIES) ×4 IMPLANT
BLADE SURG SZ11 CARB STEEL (BLADE) ×4 IMPLANT
CANNULA CAP OBTURATR AIRSEAL 8 (CAP) ×4 IMPLANT
CATH ROBINSON RED A/P 16FR (CATHETERS) ×4 IMPLANT
CATH URTH 16FR FL 2W BLN LF (CATHETERS) ×4 IMPLANT
COVER TIP SHEARS 8 DVNC (MISCELLANEOUS) ×4 IMPLANT
COVER WAND RF STERILE (DRAPES) ×4 IMPLANT
DEFOGGER SCOPE WARM SEASHARP (MISCELLANEOUS) ×4 IMPLANT
DERMABOND ADVANCED .7 DNX12 (GAUZE/BANDAGES/DRESSINGS) ×4 IMPLANT
DRAPE ARM DVNC X/XI (DISPOSABLE) ×12 IMPLANT
DRAPE COLUMN DVNC XI (DISPOSABLE) ×4 IMPLANT
DRIVER NDL MEGA 8 DVNC XI (INSTRUMENTS) ×4 IMPLANT
DRIVER NDLE MEGA DVNC XI (INSTRUMENTS) ×4 IMPLANT
DRSG TELFA 3X8 NADH STRL (GAUZE/BANDAGES/DRESSINGS) IMPLANT
ELECTRODE REM PT RTRN 9FT ADLT (ELECTROSURGICAL) ×4 IMPLANT
FORCEPS BPLR FENES DVNC XI (FORCEP) ×4 IMPLANT
GAUZE 4X4 16PLY ~~LOC~~+RFID DBL (SPONGE) ×4 IMPLANT
GLOVE BIO SURGEON STRL SZ7 (GLOVE) ×16 IMPLANT
GLOVE BIOGEL PI IND STRL 7.5 (GLOVE) ×16 IMPLANT
GLOVE INDICATOR 7.5 STRL GRN (GLOVE) ×16 IMPLANT
GOWN STRL REUS W/ TWL LRG LVL3 (GOWN DISPOSABLE) ×16 IMPLANT
GRASPER SUT TROCAR 14GX15 (MISCELLANEOUS) ×4 IMPLANT
IRRIGATION STRYKERFLOW (MISCELLANEOUS) IMPLANT
IV 0.9% NACL 1000 ML (IV SOLUTION) IMPLANT
IV LR 1000 ML (IV SOLUTION) ×4 IMPLANT
KIT PINK PAD W/HEAD ARM REST (MISCELLANEOUS) ×4 IMPLANT
LABEL OR SOLS (LABEL) ×4 IMPLANT
MANIFOLD NEPTUNE II (INSTRUMENTS) ×4 IMPLANT
MANIPULATOR UTERINE 4.5 ZUMI (MISCELLANEOUS) ×4 IMPLANT
MANIPULATOR VCARE LG CRV RETR (MISCELLANEOUS) IMPLANT
MANIPULATOR VCARE STD CRV RETR (MISCELLANEOUS) IMPLANT
NDL 21 GA WING INFUSION (NEEDLE) IMPLANT
NDL DRIVE SUT CUT DVNC (INSTRUMENTS) ×4 IMPLANT
NEEDLE 21 GA WING INFUSION (NEEDLE) IMPLANT
NEEDLE DRIVE SUT CUT DVNC (INSTRUMENTS) ×4 IMPLANT
NS IRRIG 500ML POUR BTL (IV SOLUTION) ×4 IMPLANT
OBTURATOR OPTICALSTD 8 DVNC (TROCAR) ×4 IMPLANT
OCCLUDER COLPOPNEUMO (BALLOONS) ×4 IMPLANT
PACK GYN LAPAROSCOPIC (MISCELLANEOUS) ×4 IMPLANT
PAD PREP OB/GYN DISP 24X41 (PERSONAL CARE ITEMS) ×4 IMPLANT
SCISSORS MNPLR CVD DVNC XI (INSTRUMENTS) ×4 IMPLANT
SCRUB CHG 4% DYNA-HEX 4OZ (MISCELLANEOUS) ×4 IMPLANT
SEAL UNIV 5-12 XI (MISCELLANEOUS) ×12 IMPLANT
SEALER VESSEL EXT DVNC XI (MISCELLANEOUS) IMPLANT
SET CYSTO IRRIGATION (SET/KITS/TRAYS/PACK) ×4 IMPLANT
SET TUBE FILTERED XL AIRSEAL (SET/KITS/TRAYS/PACK) ×4 IMPLANT
SET TUBE SMOKE EVAC HIGH FLOW (TUBING) ×4 IMPLANT
SOLN 0.9% NACL POUR BTL 1000ML (IV SOLUTION) ×4 IMPLANT
SOLN STERILE WATER 500 ML (IV SOLUTION) ×4 IMPLANT
SOLUTION ELECTROSURG ANTI STCK (MISCELLANEOUS) ×4 IMPLANT
SOLUTION PREP PVP 2OZ (MISCELLANEOUS) ×4 IMPLANT
SURGILUBE 2OZ TUBE FLIPTOP (MISCELLANEOUS) ×4 IMPLANT
SUT STRATA 2-0 30 CT-2 (SUTURE) IMPLANT
SUT STRATA PDS 0 30 CT-2.5 (SUTURE) ×4 IMPLANT
SUT VIC AB 0 CT2 27 (SUTURE) ×8 IMPLANT
SUTURE MNCRL 4-0 27XMF (SUTURE) ×4 IMPLANT
SUTURE STRAT PDS 2-0 23 CT-2.5 (SUTURE) ×4 IMPLANT
SYR 50ML LL SCALE MARK (SYRINGE) ×4 IMPLANT
TRAP FLUID SMOKE EVACUATOR (MISCELLANEOUS) ×4 IMPLANT
TUBING ART PRESS 48 MALE/FEM (TUBING) IMPLANT

## 2024-12-03 NOTE — Discharge Instructions (Addendum)
 Discharge instructions after  robotically-assisted total laparoscopic hysterectomy   For the next three days, take celebrex  every 12 hours and acetaminophen  on a schedule every 8 hours. You can take them together.  I also gave you gabapentin  for nighttime, to help you sleep and also to control pain. I know you are already taking gabapentin , so you can add to it for 800mg  total at night before bedtime. You also have a narcotic, oxycodone , to take as needed if the above medicines don't help. IF THE OXYCODONE  doesn't help, please call my office and I will send in hydrocodone, which helped you last time.  Postop constipation is a major cause of pain. Stay well hydrated, walk as you tolerate, and take over the counter senna as well as stool softeners if you need them.   Signs and Symptoms to Report Call our office at [phone] if you have any of the following.   Fever over 100.4 degrees or higher  Severe stomach pain not relieved with pain medications  Bright red bleeding thats heavier than a period that does not slow with rest  To go the bathroom a lot (frequency), you cant hold your urine (urgency), or it hurts when you empty your bladder (urinate)  Chest pain  Shortness of breath  Pain in the calves of your legs  Severe nausea and vomiting not relieved with anti-nausea medications  Signs of infection around your wounds, such as redness, hot to touch, swelling, green/yellow drainage (like pus), bad smelling discharge  Any concerns  What You Can Expect after Surgery  You may see some pink tinged, bloody fluid and bruising around the wound. This is normal.  You may notice shoulder and neck pain. This is caused by the gas used during surgery to expand your abdomen so your surgeon could get to the uterus easier.  You may have a sore throat because of the tube in your mouth during general anesthesia. This will go away in 2 to 3 days.  You may have some stomach cramps.  You may notice  spotting on your panties.  You may have pain around the incision sites.   Activities after Your Discharge Follow these guidelines to help speed your recovery at home:  Do the coughing and deep breathing as you did in the hospital for 2 weeks. Use the small blue breathing device, called the incentive spirometer for 2 weeks.  Dont drive if you are in pain or taking narcotic pain medicine. You may drive when you can safely slam on the brakes, turn the wheel forcefully, and rotate your torso comfortably. This is typically 1-2 weeks. Practice in a parking lot or side street prior to attempting to drive regularly.   Ask others to help with household chores for 4 weeks.  Do not lift anything heavier that 10 pounds for 4-6 weeks. This includes pets, children, and groceries.  Dont do strenuous activities, exercises, or sports like vacuuming, tennis, squash, etc. until your doctor says it is safe to do so. ---Maintain pelvic rest for 12 weeks. This means nothing in the vagina or rectum at all (no douching, tampons, intercourse) for 12 weeks.   Walk as you feel able. Rest often since it may take two or three weeks for your energy level to return to normal.   You may climb stairs  Avoid constipation:   -Eat fruits, vegetables, and whole grains. Eat small meals as your appetite will take time to return to normal.   -Drink 6 to 8  glasses of water each day unless your doctor has told you to limit your fluids.   -Use a laxative or stool softener as needed if constipation becomes a problem. You may take Miralax, metamucil, Citrucil, Colace, Senekot, FiberCon, etc. If this does not relieve the constipation, try two tablespoons of Milk Of Magnesia every 8 hours until your bowels move.   You may shower. Gently wash the wounds with a mild soap and water. Pat dry.  Do not get in a hot tub, swimming pool, etc. for 6 weeks.  Do not use lotions, oils, powders on the wounds.  Do not douche, use tampons, or have sex  until your doctor says it is okay.  Take your pain medicine when you need it. The medicine may not work as well if the pain is bad.  Take the medicines you were taking before surgery. Other medications you will need are pain medications (Norco or Percocet) and nausea medications (Zofran ).    Here is a helpful article from the website Http://mitchell.org/, regarding constipation  Here are reasons why constipation occurs after surgery: 1) During the operation and in the recovery room, most people are given opioid pain medication, primarily through an IV, to treat moderate or severe pain. Intravenous opioids include morphine, Dilaudid and fentanyl . After surgery, patients are often prescribed opioid pain medication to take by mouth at home, including codeine, Vicodin, Norco, and Percocet. All of these medications cause constipation by slowing down the movement of your intestine. 2) Changes in your diet before surgery can be another culprit. It is common to get specific instructions to change how you normally eat or drink before your surgery, like only having liquids the day before or not having anything to eat or drink after midnight the night before surgery. For this reason, temporary dehydration may occur. This, along with not eating or only having liquids, means that you are getting less fiber than usual. Both these factors contribute to constipation. 3) Changes in your diet after surgery can also contribute to the problem. Although many people dont have dietary restrictions after operations, being under anesthesia can make you lose your appetite for several hours and maybe even days. Some people can even have nausea or vomiting. Not eating or drinking normally means that you are not getting enough fiber and you can get dehydrated, both leading to constipation. 4) Lying in a bed more than usual--which happens before, during and after surgery--combined with the medications and diet changes, all work together  to slow down your colon and make your poop turn to rock.  No one likes to be constipated.  Lets face it, its not a pleasant feeling when you dont poop for days, then strain on the toilet to finally pass something large enough to cause damage. An ounce of prevention is worth a pound of cure, so: Assume you will be constipated. Plan and prepare accordingly. Post-surgery is one of those unique situations where the temporary use of laxatives can make a world of difference. Always consult with your doctor, and recognize that if you wait several days after surgery to take a laxative, the constipation might be too severe for these over-the-counter options. It is always important to discuss all medications you plan on taking with your doctor. Ask your doctor if you can start the laxative immediately after surgery. *  Here are go-to post-surgery laxatives: Senna: Senna is an herb that acts as a stimulant laxative, meaning it increases the activity of the intestine to cause you  to have a bowel movement. It comes in many forms, but senna pills are easy to take and are sold over the counter at almost all pharmacies. Since opioid pain medications slow down the activity of the intestine, it makes sense to take a medication to help reverse that side effect. Long-term use of a stimulant laxative is not a good idea since it can make your colon lazy and not function properly; however, temporary use immediately after surgery is acceptable. In general, if you are able to eat a normal diet, taking senna soon after surgery works the best. Senna usually works within hours to produce a bowel movement, but this is less predictable when you are taking different medications after surgery. Try not to wait several days to start taking senna, as often it is too late by then. Just like with all medications or supplements, check with your doctor before starting new treatment.   Magnesium: Magnesium is an important mineral that  our body needs. We get magnesium from some foods that we eat, especially foods that are high in fiber such as broccoli, almonds and whole grains. There are also magnesium-based medications used to treat constipation including milk of magnesia (magnesium hydroxide), magnesium citrate and magnesium oxide. They work by drawing water into the intestine, putting it into the class of osmotic laxatives. Magnesium products in low doses appear to be safe, but if taken in very large doses, can lead to problems such as irregular heartbeat, low blood pressure and even death. It can also affect other medications you might be taking, therefore it is important to discuss using magnesium with your physician and pharmacist before initiating therapy. Most over-the-counter magnesium laxatives work very well to help with the constipation related to surgery, but sometimes they work too well and lead to diarrhea. Make sure you are somewhere with easy access to a bathroom, just in case.   Bisacodyl: Bisacodyl (generic name) is sold under brand names such as Dulcolax. Much like senna, it is a stimulant laxative, meaning it makes your intestines move more quickly to push out the stool. This is another good choice to start taking as soon as your doctor says you can take a laxative after surgery. It comes in pill form and as a suppository, which is a good choice for people who cannot or are not allowed to swallow pills. Studies have shown that it works as a laxative, but like most of these medications, you should use this on a short-term basis only.   Enema: Enemas strike fear in many people, but FEAR NOT! Its nowhere near as big a deal as you may think. An enema is just a way to get some liquid into your rectum by placing a specially designed device through your anus. If you have never done one, it might seem like a painful, unpleasant, uncomfortable, complicated and lengthy procedure. But in reality, its simple, takes just a few  seconds and is highly effective. The small ready-made bottles you buy at the pharmacy are much easier than the hose/large rubber container type. Those recommended positions illustrated in some instructions are generally not necessary to place the enema. Its very similar to the insertion of a tampon, requiring a slight squat. Some extra lubrication on the enemas tip (or on your anus) will make it a breeze. In certain cases, there is no substitute for a good enema. For example, if someone has not pooped for a few days, the beginning of the poop waiting to come out can  become rock hard. Passing that hard stool can lead to much pain and problems like anal fissures. Inserting a little liquid to break up the rock-hard stool will help make its passage much easier. Enemas come with different liquids. Most come with saline, but there are also mineral oil options. You can also use warm water in the reusable enema containers. They all work. But since saline can sometimes be irritating, so try a mineral oil or water enema instead.  Here are commonly recommended constipation medications that do not work well for post-surgery constipation: Docusate: Docusate (generic name) most commonly referred to as Colace (brand name) is not really a laxative, but is classified as a stool softener. Although this medication is commonly prescribed, it is not recommended for several reasons: 1) there is no good medical evidence that it works 2) even if it has an effect, which is very questionable, it is minimal and cannot combat the intestinal slowing caused by the opioid medications. Skip docusate to save money and space in your pillbox for something more effective.  PEG: Miralax (brand name) is basically a chemical called polyethylene glycol (PEG) and it has gained tremendous popularity as a laxative. This product is an osmotic laxative meaning it works by pulling water into the stool, making it softer. This is very similar to the  action of natural fiber in foods and supplements. Therefore, the effect seen by this medication is not immediate, causing a bowel movement in a day or more. Is this medication strong enough to battle the constipation related to having an operation? Maybe for some people not prone to constipation. But for most people, other laxatives are better to prevent constipation after surgery.

## 2024-12-03 NOTE — Anesthesia Preprocedure Evaluation (Signed)
 Anesthesia Evaluation  Patient identified by MRN, date of birth, ID band Patient awake    Reviewed: Allergy & Precautions, H&P , NPO status , Patient's Chart, lab work & pertinent test results, reviewed documented beta blocker date and time   History of Anesthesia Complications (+) PROLONGED EMERGENCE and history of anesthetic complications  Airway Mallampati: III  TM Distance: >3 FB Neck ROM: full    Dental  (+) Dental Advidsory Given, Teeth Intact, Chipped   Pulmonary neg pulmonary ROS, former smoker   Pulmonary exam normal breath sounds clear to auscultation       Cardiovascular Exercise Tolerance: Good hypertension, (-) angina (-) Past MI and (-) Cardiac Stents Normal cardiovascular exam(-) dysrhythmias (-) Valvular Problems/Murmurs Rhythm:regular Rate:Normal     Neuro/Psych negative neurological ROS  negative psych ROS   GI/Hepatic Neg liver ROS,GERD (history of, none since weight loss)  Controlled,,  Endo/Other  neg diabetesHypothyroidism    Renal/GU negative Renal ROS  negative genitourinary   Musculoskeletal   Abdominal   Peds  Hematology negative hematology ROS (+)   Anesthesia Other Findings Past Medical History: No date: Abnormal uterine bleeding (AUB) No date: Cervical radiculopathy No date: Complication of anesthesia     Comment:  delayed emergence after neck surgery No date: Dysmenorrhea No date: Hypertension No date: Hypothyroidism No date: Osteoarthritis of right knee No date: PCOS (polycystic ovarian syndrome) No date: Pre-diabetes No date: Sleep apnea     Comment:  lost weight and no longer uses cpap No date: Spinal stenosis, cervical region   Reproductive/Obstetrics negative OB ROS                              Anesthesia Physical Anesthesia Plan  ASA: 2  Anesthesia Plan: General   Post-op Pain Management:    Induction: Intravenous  PONV Risk Score and  Plan: 3 and Ondansetron , Dexamethasone , Midazolam  and Treatment may vary due to age or medical condition  Airway Management Planned: Oral ETT  Additional Equipment:   Intra-op Plan:   Post-operative Plan: Extubation in OR  Informed Consent: I have reviewed the patients History and Physical, chart, labs and discussed the procedure including the risks, benefits and alternatives for the proposed anesthesia with the patient or authorized representative who has indicated his/her understanding and acceptance.     Dental Advisory Given  Plan Discussed with: Anesthesiologist, CRNA and Surgeon  Anesthesia Plan Comments:          Anesthesia Quick Evaluation

## 2024-12-03 NOTE — Interval H&P Note (Signed)
 History and Physical Interval Note:  12/03/2024 10:58 AM  Sari Hartshorn  has presented today for surgery, with the diagnosis of AUB-F dysmenorrhea.  The various methods of treatment have been discussed with the patient and family. After consideration of risks, benefits and other options for treatment, the patient has consented to  Procedures with comments: HYSTERECTOMY, TOTAL, LAPAROSCOPIC, ROBOT-ASSISTED WITH SALPINGECTOMY (Bilateral) CYSTOSCOPY (N/A) EXCISION, ENDOMETRIOSIS, ROBOTIC ASSISTED, LAPAROSCOPIC (N/A) - POSSIBLE ENDO RESECTION LYSIS, ADHESIONS, ROBOT-ASSISTED, LAPAROSCOPIC (N/A) - POSSIBLE MORECELLATION as a surgical intervention.  The patient's history has been reviewed, patient examined, no change in status, stable for surgery.  I have reviewed the patient's chart and labs.  Questions were answered to the patient's satisfaction.     Heather Penton

## 2024-12-03 NOTE — Op Note (Addendum)
 Crystin Lechtenberg PROCEDURE DATE: 12/03/2024  PREOPERATIVE DIAGNOSIS: Chronic pelvic pain POSTOPERATIVE DIAGNOSIS: The same PROCEDURE:  HYSTERECTOMY, TOTAL, LAPAROSCOPIC, ROBOT-ASSISTED WITH SALPINGECTOMY: 58571 (CPT)  CYSTOSCOPY: 52000 (CPT)  EXCISION, ENDOMETRIOSIS, ROBOTIC ASSISTED, LAPAROSCOPIC: 41337 (CPT)  LYSIS, ADHESIONS, ROBOT-ASSISTED, LAPAROSCOPIC: 58660 (CPT)  OOPHORECTOMY, ROBOT ASSISTED, LAPAROSCOPIC, D5:  LEFT oophorectomy Ureterolysis for retroperitoneal fibroisis and endometriosis scarring along left uterosacral ligament  McCall's modified culdoplasty   SURGEON:  Dr. Heather Penton, MD ASSISTANT: RNFA Anesthesiologist:  Anesthesiologist: Dario Barter, MD CRNA: Lennie Lamarr HERO, CRNA; Landy Hone D, CRNA  An experienced assistant was required given the standard of surgical care given the complexity of the case.  This assistant was needed for exposure, dissection, suctioning, retraction, instrument exchange: RNFA provided expert assistance required for safety during the procedure.   INDICATIONS: 43 y.o. F  here for definitive surgical management secondary to the indications listed under preoperative diagnoses; please see preoperative note for further details.  Risks of surgery were discussed with the patient including but not limited to: bleeding which may require transfusion or reoperation; infection which may require antibiotics; injury to bowel, bladder, ureters or other surrounding organs; need for additional procedures; thromboembolic phenomenon, incisional problems and other postoperative/anesthesia complications. Written informed consent was obtained.    FINDINGS:    External genitalia, vaginal canal and cervix negative for lesions.  Grade 2 apical prolapse.  Perihepatic adhesions in the right upper quadrant.  Normal stomach and otherwise upper abdomen.  Appendix not visualized  Pelvis with significant adhesions and endometriosis stage II.  Dark powder  burn lesions along the left uterosacral ligament and in the left ovarian fossa.  Uterus was enlarged and boggy, with multiple irregular fibroids throughout.  Endometriosis adhesions were noted between the bladder and the cervix, more densely on the left than the right, with multiple Allen-Masters windows that were removed with the specimen.  Left lower quadrant with the sigmoid colon densely adherent to the left pelvic sidewall, where strong adhesions had caused the colon to make a 90 degree turn across the body before descending into the pelvis.    ANESTHESIA:    General INTRAVENOUS FLUIDS:800  ml ESTIMATED BLOOD LOSS:20 ml URINE OUTPUT: 300 ml  SPECIMENS: Uterus, cervix, bilateral fallopian tubes and left ovaries, left uterosacral ligament endometriosis excision  COMPLICATIONS: None immediate  PROCEDURE IN DETAIL: After informed consent was obtained, the patient was taken to the operating room where general anesthesia was obtained without difficulty. The patient was positioned in the dorsal lithotomy position in [place] stirrups and her arms were carefully tucked at her sides and the usual precautions were taken. Deep Trendelenburg (20-25 deg) was established to confirm that she does not shift on the table.  She was prepped and draped in normal sterile fashion.  Time-out was performed and a Foley catheter was placed into the bladder. A standard VCare uterine manipulator was then placed in the uterus without incident.  Preoperative prophylactic antibiotics were given through her iv.  After infiltration of local anesthetic at the proposed trocar sites, an 8 mm incision was created at the above the umbilicus in the midline, and an AirSeal 5mm was placed under direct visualization, after confirmation of OG tube working well. Pneumoperitoneum was created to a pressure of 15 mm Hg. The camera was placed and the abdomin surveyed, noting intact bowel below the site of entry. A survey of the pelvis and  upper abdomen revealed the above findings. One right and one left lateral 8-mm robotic ports were placed under direct visualization.  The patient was placed in deepTrendelenburg and the bowel was displaced up into the upper abdomen. The robot was left side docked. The instruments were placed under direct visualization.   After review of the pelvis, attention was turned to the left sigmoid colon and the left lower quadrant.  This was gently mobilized to allow for a softer colon angle to dive into the pelvis.  It also allowed us  to visualize the IP ligament, which was cauterized and removed.  The left ureter needed to be visualized in this space, and left ureterolysis down to the mid pelvis was undertaken to allow for safe management of the IP ligament.  The ureters were identified bilaterally coursing outside of the operative field. Round ligaments were divided on each side with the EndoShears and the retroperitoneal space was opened bilaterally. The posterior leaflet of the broad was taken down to the level of the IP ligament. The anterior leaflet of the broad ligament was carefully taken down to the midline.  A bladder flap was created and the bladder was dissected down off the lower uterine segment and cervix using endoshears and electrocautery.    Left Ovariolysis was performed and the ovary was dissected medially with care to avoid the ureter.  The infundibulopelvic ligament was skeletonized, sealed and divided. The peritoneum was taken down to the level of the internal os, and the uterine arteries skeletonized. With strong cephalad pressure from the V-care, bipolar cautery was used to seal and transect the uterine arteries, and the pedicles allowed to fall away laterally.  On the rightThe Fallopian tube was divided from the ovary and care taken to hemostatically transect the utero-ovarian ligament. The peritoneum was taken down to the level of the internal os, and the uterine arteries skeletonized.  With strong cephalad pressure from the V-care, bipolar cautery was used to seal and transect the uterine arteries, and the pedicles allowed to fall away laterally.  A colpotomy was performed circumferentially along the V-Care ring with monopolar electrocautery and the cervix was incised from the vagina using the laparoscopic scissors. The specimen was removed through the vagina.  A pneumo balloon was placed in the vagina and the vaginal cuff was then closed in a running continuous fashion using the  0 barbed PDS suture with careful attention to include the vaginal cuff angles, the uterosacral ligaments and the vaginal mucosa within the closure.  The uterosacral ligaments were dissected back, not quite up to the ischial spines, and were firmly attached to the vaginal apex and both the rectovaginal and the vesicovaginal fascia.  Review of the pelvic peritoneum revealed several spots of remaining endometriosis.  These were excised entirely and sent with the specimen.  Hemostasis was secured with intraabdominal pressure and review of all surgical sites. The intraperitoneal pressure was dropped, and all planes of dissection, vascular pedicles and the vaginal cuff were found to be hemostatic.  The robot was undocked.   Attention was turned to the bladder and cystoscopy showed vigorous bilateral ureteral jets.  No stitches were visualized in the bladder during cystoscopy.  There was an area that did appear to be endometriosis along the bladder mucosa.  This remained intact.  The lateral trocars were removed under visualization.  The CO2 gas was released and several deep breaths given to remove any remaining CO2 from the peritoneal cavity.  The skin incisions were closed with 4-0 Monocryl subcuticular stitch and Dermabond.    Anesthesia was reversed without difficulty.  The patient tolerated the procedure well.  Sponge, lap and needle counts were correct x2.  The patient was taken to recovery room in  excellent condition.

## 2024-12-03 NOTE — Anesthesia Procedure Notes (Signed)
 Procedure Name: Intubation Date/Time: 12/03/2024 11:11 AM  Performed by: Lennie Lamarr HERO, CRNAPre-anesthesia Checklist: Patient identified, Emergency Drugs available, Suction available and Patient being monitored Patient Re-evaluated:Patient Re-evaluated prior to induction Oxygen Delivery Method: Circle System Utilized Preoxygenation: Pre-oxygenation with 100% oxygen Induction Type: IV induction Ventilation: Mask ventilation without difficulty Laryngoscope Size: McGrath and 4 Grade View: Grade I Tube type: Oral Tube size: 7.0 mm Number of attempts: 1 Airway Equipment and Method: Stylet and Oral airway Placement Confirmation: ETT inserted through vocal cords under direct vision, positive ETCO2 and breath sounds checked- equal and bilateral Secured at: 21 cm Tube secured with: Tape Dental Injury: Teeth and Oropharynx as per pre-operative assessment

## 2024-12-03 NOTE — Transfer of Care (Signed)
 Immediate Anesthesia Transfer of Care Note  Patient: Rachel Rojas  Procedure(s) Performed: HYSTERECTOMY, TOTAL, LAPAROSCOPIC, ROBOT-ASSISTED WITH SALPINGECTOMY (Bilateral: Uterus) CYSTOSCOPY (Bladder) EXCISION, ENDOMETRIOSIS, ROBOTIC ASSISTED, LAPAROSCOPIC (Pelvis) LYSIS, ADHESIONS, ROBOT-ASSISTED, LAPAROSCOPIC (Pelvis) OOPHORECTOMY, ROBOT ASSISTED, LAPAROSCOPIC, D5 (Left: Pelvis)  Patient Location: PACU  Anesthesia Type:General  Level of Consciousness: drowsy and patient cooperative  Airway & Oxygen Therapy: Patient Spontanous Breathing and Patient connected to face mask oxygen  Post-op Assessment: Report given to RN, Post -op Vital signs reviewed and stable, and Patient moving all extremities X 4  Post vital signs: Reviewed and stable  Last Vitals:  Vitals Value Taken Time  BP 135/85 12/03/24 13:30  Temp    Pulse 84 12/03/24 13:32  Resp 14 12/03/24 13:32  SpO2 96 % 12/03/24 13:32  Vitals shown include unfiled device data.  Last Pain:  Vitals:   12/03/24 0956  TempSrc: Temporal  PainSc: 4          Complications: No notable events documented.

## 2024-12-04 ENCOUNTER — Encounter: Payer: Self-pay | Admitting: Obstetrics and Gynecology

## 2024-12-05 NOTE — Anesthesia Postprocedure Evaluation (Signed)
"   Anesthesia Post Note  Patient: Rachel Rojas  Procedure(s) Performed: HYSTERECTOMY, TOTAL, LAPAROSCOPIC, ROBOT-ASSISTED WITH SALPINGECTOMY (Bilateral: Uterus) CYSTOSCOPY (Bladder) EXCISION, ENDOMETRIOSIS, ROBOTIC ASSISTED, LAPAROSCOPIC (Pelvis) LYSIS, ADHESIONS, ROBOT-ASSISTED, LAPAROSCOPIC (Pelvis) OOPHORECTOMY, ROBOT ASSISTED, LAPAROSCOPIC, D5 (Left: Pelvis)  Patient location during evaluation: PACU Anesthesia Type: General Level of consciousness: awake and alert Pain management: pain level controlled Vital Signs Assessment: post-procedure vital signs reviewed and stable Respiratory status: spontaneous breathing, nonlabored ventilation, respiratory function stable and patient connected to nasal cannula oxygen Cardiovascular status: blood pressure returned to baseline and stable Postop Assessment: no apparent nausea or vomiting Anesthetic complications: no   No notable events documented.   Last Vitals:  Vitals:   12/03/24 1445 12/03/24 1536  BP: 135/83 129/87  Pulse: (!) 53 62  Resp: 13 18  Temp:  36.6 C  SpO2: 96% 97%    Last Pain:  Vitals:   12/03/24 1536  TempSrc: Temporal  PainSc:                  Prentice Murphy      "

## 2024-12-08 LAB — SURGICAL PATHOLOGY

## 2025-01-15 ENCOUNTER — Other Ambulatory Visit: Payer: Self-pay

## 2025-01-16 ENCOUNTER — Other Ambulatory Visit: Payer: Self-pay

## 2025-01-16 MED ORDER — ESTRADIOL 0.1 MG/24HR TD PTTW
1.0000 | MEDICATED_PATCH | TRANSDERMAL | 3 refills | Status: AC
  Filled 2025-01-17: qty 8, 28d supply, fill #0

## 2025-01-17 ENCOUNTER — Other Ambulatory Visit: Payer: Self-pay
